# Patient Record
Sex: Male | Born: 1970 | Race: Black or African American | Hispanic: No | Marital: Single
Health system: Southern US, Community
[De-identification: ages and names within clinical notes are randomized; demographics above are authoritative.]

## PROBLEM LIST (undated history)

## (undated) DIAGNOSIS — S2242XA Multiple fractures of ribs, left side, initial encounter for closed fracture: Secondary | ICD-10-CM

## (undated) DIAGNOSIS — S82041A Displaced comminuted fracture of right patella, initial encounter for closed fracture: Secondary | ICD-10-CM

---
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
## 1999-04-21 ENCOUNTER — Emergency Department (HOSPITAL_COMMUNITY): Admission: EM | Admit: 1999-04-21 | Discharge: 1999-04-21 | Payer: Self-pay | Admitting: Emergency Medicine

## 2000-04-08 ENCOUNTER — Encounter: Payer: Self-pay | Admitting: Emergency Medicine

## 2000-04-08 ENCOUNTER — Emergency Department (HOSPITAL_COMMUNITY): Admission: EM | Admit: 2000-04-08 | Discharge: 2000-04-08 | Payer: Self-pay | Admitting: Emergency Medicine

## 2000-07-06 ENCOUNTER — Emergency Department (HOSPITAL_COMMUNITY): Admission: EM | Admit: 2000-07-06 | Discharge: 2000-07-06 | Payer: Self-pay | Admitting: Emergency Medicine

## 2009-02-24 ENCOUNTER — Emergency Department (HOSPITAL_COMMUNITY): Admission: EM | Admit: 2009-02-24 | Discharge: 2009-02-24 | Payer: Self-pay | Admitting: Emergency Medicine

## 2010-12-18 ENCOUNTER — Emergency Department (HOSPITAL_COMMUNITY)
Admission: EM | Admit: 2010-12-18 | Discharge: 2010-12-18 | Disposition: A | Discharge status: Home / Self Care | Payer: Self-pay | Attending: Emergency Medicine | Admitting: Emergency Medicine

## 2010-12-18 DIAGNOSIS — M79609 Pain in unspecified limb: Secondary | ICD-10-CM | POA: Insufficient documentation

## 2010-12-18 DIAGNOSIS — M545 Low back pain, unspecified: Secondary | ICD-10-CM | POA: Insufficient documentation

## 2010-12-18 LAB — URINALYSIS, ROUTINE W REFLEX MICROSCOPIC
Bilirubin Urine: NEGATIVE
Glucose, UA: NEGATIVE mg/dL
Hgb urine dipstick: NEGATIVE
Ketones, ur: NEGATIVE mg/dL
Nitrite: NEGATIVE
Protein, ur: NEGATIVE mg/dL
Specific Gravity, Urine: 1.024 (ref 1.005–1.030)
Urobilinogen, UA: 1 mg/dL (ref 0.0–1.0)
pH: 7 (ref 5.0–8.0)

## 2019-01-02 ENCOUNTER — Emergency Department (HOSPITAL_COMMUNITY): Payer: BLUE CROSS/BLUE SHIELD

## 2019-01-02 ENCOUNTER — Inpatient Hospital Stay (HOSPITAL_COMMUNITY)
Admission: EM | Admit: 2019-01-02 | Discharge: 2019-01-09 | DRG: 982 | Disposition: A | Discharge status: Home Health | Payer: BLUE CROSS/BLUE SHIELD | Attending: General Surgery | Admitting: General Surgery

## 2019-01-02 ENCOUNTER — Encounter (HOSPITAL_COMMUNITY): Payer: Self-pay | Admitting: Emergency Medicine

## 2019-01-02 ENCOUNTER — Inpatient Hospital Stay (HOSPITAL_COMMUNITY): Payer: BLUE CROSS/BLUE SHIELD

## 2019-01-02 ENCOUNTER — Other Ambulatory Visit: Payer: Self-pay

## 2019-01-02 DIAGNOSIS — Y92410 Unspecified street and highway as the place of occurrence of the external cause: Secondary | ICD-10-CM

## 2019-01-02 DIAGNOSIS — M959 Acquired deformity of musculoskeletal system, unspecified: Secondary | ICD-10-CM | POA: Diagnosis not present

## 2019-01-02 DIAGNOSIS — S52571A Other intraarticular fracture of lower end of right radius, initial encounter for closed fracture: Secondary | ICD-10-CM | POA: Diagnosis present

## 2019-01-02 DIAGNOSIS — S272XXA Traumatic hemopneumothorax, initial encounter: Principal | ICD-10-CM | POA: Diagnosis present

## 2019-01-02 DIAGNOSIS — M898X9 Other specified disorders of bone, unspecified site: Secondary | ICD-10-CM | POA: Diagnosis present

## 2019-01-02 DIAGNOSIS — S82121A Displaced fracture of lateral condyle of right tibia, initial encounter for closed fracture: Secondary | ICD-10-CM | POA: Diagnosis not present

## 2019-01-02 DIAGNOSIS — Z1159 Encounter for screening for other viral diseases: Secondary | ICD-10-CM

## 2019-01-02 DIAGNOSIS — S82141A Displaced bicondylar fracture of right tibia, initial encounter for closed fracture: Secondary | ICD-10-CM | POA: Diagnosis present

## 2019-01-02 DIAGNOSIS — S82001A Unspecified fracture of right patella, initial encounter for closed fracture: Secondary | ICD-10-CM | POA: Diagnosis not present

## 2019-01-02 DIAGNOSIS — S270XXA Traumatic pneumothorax, initial encounter: Secondary | ICD-10-CM | POA: Diagnosis not present

## 2019-01-02 DIAGNOSIS — S52511A Displaced fracture of right radial styloid process, initial encounter for closed fracture: Secondary | ICD-10-CM | POA: Diagnosis not present

## 2019-01-02 DIAGNOSIS — S83004A Unspecified dislocation of right patella, initial encounter: Secondary | ICD-10-CM | POA: Diagnosis present

## 2019-01-02 DIAGNOSIS — S83014A Lateral dislocation of right patella, initial encounter: Secondary | ICD-10-CM | POA: Diagnosis not present

## 2019-01-02 DIAGNOSIS — S76191A Other specified injury of right quadriceps muscle, fascia and tendon, initial encounter: Secondary | ICD-10-CM | POA: Diagnosis not present

## 2019-01-02 DIAGNOSIS — T508X5A Adverse effect of diagnostic agents, initial encounter: Secondary | ICD-10-CM | POA: Diagnosis present

## 2019-01-02 DIAGNOSIS — E876 Hypokalemia: Secondary | ICD-10-CM | POA: Diagnosis not present

## 2019-01-02 DIAGNOSIS — S2242XA Multiple fractures of ribs, left side, initial encounter for closed fracture: Secondary | ICD-10-CM | POA: Diagnosis present

## 2019-01-02 DIAGNOSIS — S82041A Displaced comminuted fracture of right patella, initial encounter for closed fracture: Secondary | ICD-10-CM | POA: Diagnosis not present

## 2019-01-02 DIAGNOSIS — Z452 Encounter for adjustment and management of vascular access device: Secondary | ICD-10-CM

## 2019-01-02 DIAGNOSIS — R52 Pain, unspecified: Secondary | ICD-10-CM

## 2019-01-02 DIAGNOSIS — J939 Pneumothorax, unspecified: Secondary | ICD-10-CM | POA: Diagnosis present

## 2019-01-02 DIAGNOSIS — S82091A Other fracture of right patella, initial encounter for closed fracture: Secondary | ICD-10-CM | POA: Diagnosis not present

## 2019-01-02 DIAGNOSIS — S43102A Unspecified dislocation of left acromioclavicular joint, initial encounter: Secondary | ICD-10-CM | POA: Diagnosis present

## 2019-01-02 DIAGNOSIS — E559 Vitamin D deficiency, unspecified: Secondary | ICD-10-CM | POA: Diagnosis not present

## 2019-01-02 DIAGNOSIS — S52501A Unspecified fracture of the lower end of right radius, initial encounter for closed fracture: Secondary | ICD-10-CM | POA: Diagnosis present

## 2019-01-02 DIAGNOSIS — S43101A Unspecified dislocation of right acromioclavicular joint, initial encounter: Secondary | ICD-10-CM | POA: Diagnosis not present

## 2019-01-02 DIAGNOSIS — D62 Acute posthemorrhagic anemia: Secondary | ICD-10-CM | POA: Diagnosis not present

## 2019-01-02 DIAGNOSIS — S52561A Barton's fracture of right radius, initial encounter for closed fracture: Secondary | ICD-10-CM | POA: Diagnosis not present

## 2019-01-02 DIAGNOSIS — S43122A Dislocation of left acromioclavicular joint, 100%-200% displacement, initial encounter: Secondary | ICD-10-CM | POA: Diagnosis present

## 2019-01-02 DIAGNOSIS — S76111A Strain of right quadriceps muscle, fascia and tendon, initial encounter: Secondary | ICD-10-CM | POA: Diagnosis present

## 2019-01-02 DIAGNOSIS — S43109A Unspecified dislocation of unspecified acromioclavicular joint, initial encounter: Secondary | ICD-10-CM

## 2019-01-02 DIAGNOSIS — T148XXA Other injury of unspecified body region, initial encounter: Secondary | ICD-10-CM

## 2019-01-02 DIAGNOSIS — N179 Acute kidney failure, unspecified: Secondary | ICD-10-CM | POA: Diagnosis present

## 2019-01-02 DIAGNOSIS — Z4682 Encounter for fitting and adjustment of non-vascular catheter: Secondary | ICD-10-CM

## 2019-01-02 DIAGNOSIS — S82002A Unspecified fracture of left patella, initial encounter for closed fracture: Secondary | ICD-10-CM | POA: Diagnosis not present

## 2019-01-02 DIAGNOSIS — S82201A Unspecified fracture of shaft of right tibia, initial encounter for closed fracture: Secondary | ICD-10-CM | POA: Diagnosis not present

## 2019-01-02 DIAGNOSIS — S52611A Displaced fracture of right ulna styloid process, initial encounter for closed fracture: Secondary | ICD-10-CM | POA: Diagnosis not present

## 2019-01-02 DIAGNOSIS — R413 Other amnesia: Secondary | ICD-10-CM | POA: Diagnosis not present

## 2019-01-02 DIAGNOSIS — Z419 Encounter for procedure for purposes other than remedying health state, unspecified: Secondary | ICD-10-CM

## 2019-01-02 DIAGNOSIS — Q899 Congenital malformation, unspecified: Secondary | ICD-10-CM

## 2019-01-02 DIAGNOSIS — F17210 Nicotine dependence, cigarettes, uncomplicated: Secondary | ICD-10-CM | POA: Diagnosis present

## 2019-01-02 DIAGNOSIS — S43132A Dislocation of left acromioclavicular joint, greater than 200% displacement, initial encounter: Secondary | ICD-10-CM | POA: Diagnosis not present

## 2019-01-02 LAB — URINALYSIS, ROUTINE W REFLEX MICROSCOPIC
Bacteria, UA: NONE SEEN
Bilirubin Urine: NEGATIVE
Glucose, UA: NEGATIVE mg/dL
Ketones, ur: NEGATIVE mg/dL
Leukocytes,Ua: NEGATIVE
Nitrite: NEGATIVE
Protein, ur: NEGATIVE mg/dL
RBC / HPF: >50 RBC/hpf — ABNORMAL HIGH (ref 0–5)
Specific Gravity, Urine: 1.038 — ABNORMAL HIGH (ref 1.005–1.030)
pH: 5 (ref 5.0–8.0)

## 2019-01-02 LAB — RAPID URINE DRUG SCREEN, HOSP PERFORMED
Amphetamines: NOT DETECTED
Barbiturates: NOT DETECTED
Benzodiazepines: NOT DETECTED
Cocaine: NOT DETECTED
Opiates: NOT DETECTED
Tetrahydrocannabinol: NOT DETECTED

## 2019-01-02 LAB — SARS CORONAVIRUS 2 BY RT PCR (HOSPITAL ORDER, PERFORMED IN ~~LOC~~ HOSPITAL LAB): SARS Coronavirus 2: NEGATIVE

## 2019-01-02 LAB — COMPREHENSIVE METABOLIC PANEL
ALT: 15 U/L (ref 0–44)
AST: 25 U/L (ref 15–41)
Albumin: 3.6 g/dL (ref 3.5–5.0)
Alkaline Phosphatase: 83 U/L (ref 38–126)
Anion gap: 12 (ref 5–15)
BUN: 9 mg/dL (ref 6–20)
CO2: 25 mmol/L (ref 22–32)
Calcium: 9.1 mg/dL (ref 8.9–10.3)
Chloride: 103 mmol/L (ref 98–111)
Creatinine, Ser: 1.62 mg/dL — ABNORMAL HIGH (ref 0.61–1.24)
GFR calc Af Amer: 58 mL/min — ABNORMAL LOW (ref >60)
GFR calc non Af Amer: 50 mL/min — ABNORMAL LOW (ref >60)
Glucose, Bld: 143 mg/dL — ABNORMAL HIGH (ref 70–99)
Potassium: 2.9 mmol/L — ABNORMAL LOW (ref 3.5–5.1)
Sodium: 140 mmol/L (ref 135–145)
Total Bilirubin: 0.7 mg/dL (ref 0.3–1.2)
Total Protein: 6.6 g/dL (ref 6.5–8.1)

## 2019-01-02 LAB — ETHANOL: Alcohol, Ethyl (B): <10 mg/dL (ref <10)

## 2019-01-02 LAB — CBC
HCT: 48.2 % (ref 39.0–52.0)
Hemoglobin: 16 g/dL (ref 13.0–17.0)
MCH: 31.4 pg (ref 26.0–34.0)
MCHC: 33.2 g/dL (ref 30.0–36.0)
MCV: 94.5 fL (ref 80.0–100.0)
Platelets: 259 10*3/uL (ref 150–400)
RBC: 5.1 MIL/uL (ref 4.22–5.81)
RDW: 12.7 % (ref 11.5–15.5)
WBC: 10.6 10*3/uL — ABNORMAL HIGH (ref 4.0–10.5)
nRBC: 0 % (ref 0.0–0.2)

## 2019-01-02 LAB — PROTIME-INR
INR: 1.1 (ref 0.8–1.2)
Prothrombin Time: 14.1 seconds (ref 11.4–15.2)

## 2019-01-02 LAB — SAMPLE TO BLOOD BANK

## 2019-01-02 LAB — LACTIC ACID, PLASMA: Lactic Acid, Venous: 3.6 mmol/L (ref 0.5–1.9)

## 2019-01-02 LAB — I-STAT CREATININE, ED: Creatinine, Ser: 1.5 mg/dL — ABNORMAL HIGH (ref 0.61–1.24)

## 2019-01-02 MED ORDER — FENTANYL CITRATE (PF) 100 MCG/2ML IJ SOLN
INTRAMUSCULAR | Status: AC
  Administered 2019-01-02: 25 ug
  Filled 2019-01-02: qty 2

## 2019-01-02 MED ORDER — FENTANYL CITRATE (PF) 100 MCG/2ML IJ SOLN
INTRAMUSCULAR | Status: AC | PRN
  Administered 2019-01-02: 100 ug via INTRAVENOUS

## 2019-01-02 MED ORDER — FENTANYL CITRATE (PF) 100 MCG/2ML IJ SOLN
INTRAMUSCULAR | Status: AC
  Filled 2019-01-02: qty 2

## 2019-01-02 MED ORDER — ENOXAPARIN SODIUM 40 MG/0.4ML ~~LOC~~ SOLN
40.0000 mg | Freq: Every day | SUBCUTANEOUS | Status: DC
  Administered 2019-01-03 – 2019-01-04 (×2): 40 mg via SUBCUTANEOUS
  Filled 2019-01-02 (×2): qty 0.4

## 2019-01-02 MED ORDER — SODIUM CHLORIDE 0.9 % IV SOLN
INTRAVENOUS | Status: AC | PRN
  Administered 2019-01-02: 1000 mL via INTRAVENOUS

## 2019-01-02 MED ORDER — ONDANSETRON HCL 4 MG/2ML IJ SOLN: 4.0000 mg | Freq: Four times a day (QID) | INTRAMUSCULAR | Status: DC | PRN

## 2019-01-02 MED ORDER — MIDAZOLAM HCL 2 MG/2ML IJ SOLN
INTRAMUSCULAR | Status: AC
  Administered 2019-01-02: 2 mg
  Filled 2019-01-02: qty 2

## 2019-01-02 MED ORDER — ONDANSETRON 4 MG PO TBDP: 4.0000 mg | ORAL_TABLET | Freq: Four times a day (QID) | ORAL | Status: DC | PRN

## 2019-01-02 MED ORDER — OXYCODONE HCL 5 MG PO TABS
10.0000 mg | ORAL_TABLET | ORAL | Status: DC | PRN
  Administered 2019-01-03 – 2019-01-09 (×16): 10 mg via ORAL
  Filled 2019-01-02 (×16): qty 2

## 2019-01-02 MED ORDER — METHOCARBAMOL 500 MG PO TABS
500.0000 mg | ORAL_TABLET | Freq: Three times a day (TID) | ORAL | Status: DC
  Administered 2019-01-02 – 2019-01-04 (×7): 500 mg via ORAL
  Filled 2019-01-02 (×7): qty 1

## 2019-01-02 MED ORDER — HYDRALAZINE HCL 20 MG/ML IJ SOLN: 10.0000 mg | INTRAMUSCULAR | Status: DC | PRN

## 2019-01-02 MED ORDER — OXYCODONE HCL 5 MG PO TABS
5.0000 mg | ORAL_TABLET | ORAL | Status: DC | PRN
  Administered 2019-01-03 – 2019-01-07 (×2): 5 mg via ORAL
  Filled 2019-01-02 (×2): qty 1

## 2019-01-02 MED ORDER — HYDROMORPHONE HCL 1 MG/ML IJ SOLN
1.0000 mg | INTRAMUSCULAR | Status: DC | PRN
  Administered 2019-01-02 – 2019-01-05 (×2): 1 mg via INTRAVENOUS
  Filled 2019-01-02 (×2): qty 1

## 2019-01-02 MED ORDER — IOHEXOL 300 MG/ML  SOLN
100.0000 mL | Freq: Once | INTRAMUSCULAR | Status: AC | PRN
  Administered 2019-01-02: 100 mL via INTRAVENOUS

## 2019-01-02 MED ORDER — SODIUM CHLORIDE 0.9 % IV SOLN
INTRAVENOUS | Status: DC
  Administered 2019-01-02 – 2019-01-06 (×5): via INTRAVENOUS

## 2019-01-02 MED ORDER — ACETAMINOPHEN 500 MG PO TABS
1000.0000 mg | ORAL_TABLET | Freq: Four times a day (QID) | ORAL | Status: DC
  Administered 2019-01-03 – 2019-01-07 (×13): 1000 mg via ORAL
  Administered 2019-01-07: 500 mg via ORAL
  Administered 2019-01-07: 1000 mg via ORAL
  Administered 2019-01-07: 500 mg via ORAL
  Administered 2019-01-08 – 2019-01-09 (×6): 1000 mg via ORAL
  Filled 2019-01-02 (×24): qty 2

## 2019-01-02 NOTE — ED Notes (Signed)
Patient transported to CT 

## 2019-01-02 NOTE — ED Notes (Signed)
Attempted to call report
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
## 2019-01-02 NOTE — ED Notes (Addendum)
Assisted in chest tube,  Timeout completed pt signed consent prior to medication administration.   Pt handled procedure well.
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
## 2019-01-03 ENCOUNTER — Inpatient Hospital Stay (HOSPITAL_COMMUNITY): Payer: BLUE CROSS/BLUE SHIELD

## 2019-01-03 ENCOUNTER — Other Ambulatory Visit: Payer: Self-pay

## 2019-01-03 LAB — BASIC METABOLIC PANEL
Anion gap: 11 (ref 5–15)
BUN: 10 mg/dL (ref 6–20)
CO2: 24 mmol/L (ref 22–32)
Calcium: 8.8 mg/dL — ABNORMAL LOW (ref 8.9–10.3)
Chloride: 105 mmol/L (ref 98–111)
Creatinine, Ser: 1.45 mg/dL — ABNORMAL HIGH (ref 0.61–1.24)
GFR calc Af Amer: >60 mL/min (ref >60)
GFR calc non Af Amer: 57 mL/min — ABNORMAL LOW (ref >60)
Glucose, Bld: 136 mg/dL — ABNORMAL HIGH (ref 70–99)
Potassium: 4.2 mmol/L (ref 3.5–5.1)
Sodium: 140 mmol/L (ref 135–145)

## 2019-01-03 LAB — CBC
HCT: 43 % (ref 39.0–52.0)
Hemoglobin: 14.2 g/dL (ref 13.0–17.0)
MCH: 31.1 pg (ref 26.0–34.0)
MCHC: 33 g/dL (ref 30.0–36.0)
MCV: 94.3 fL (ref 80.0–100.0)
Platelets: 193 10*3/uL (ref 150–400)
RBC: 4.56 MIL/uL (ref 4.22–5.81)
RDW: 13.1 % (ref 11.5–15.5)
WBC: 14.5 10*3/uL — ABNORMAL HIGH (ref 4.0–10.5)
nRBC: 0 % (ref 0.0–0.2)

## 2019-01-03 LAB — HIV ANTIBODY (ROUTINE TESTING W REFLEX): HIV Screen 4th Generation wRfx: NONREACTIVE

## 2019-01-03 LAB — CDS SEROLOGY

## 2019-01-03 MED ORDER — SENNA 8.6 MG PO TABS
1.0000 | ORAL_TABLET | Freq: Two times a day (BID) | ORAL | Status: DC
  Administered 2019-01-03 – 2019-01-09 (×11): 8.6 mg via ORAL
  Filled 2019-01-03 (×11): qty 1
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
## 2019-01-03 NOTE — Plan of Care (Signed)
  Problem: Education: Goal: Knowledge of General Education information will improve Description Including pain rating scale, medication(s)/side effects and non-pharmacologic comfort measures Outcome: Progressing   Problem: Health Behavior/Discharge Planning: Goal: Ability to manage health-related needs will improve Outcome: Progressing   Problem: Clinical Measurements: Goal: Ability to maintain clinical measurements within normal limits will improve Outcome: Progressing Goal: Will remain free from infection Outcome: Progressing Goal: Diagnostic test results will improve Outcome: Progressing Goal: Respiratory complications will improve Outcome: Progressing   Problem: Activity: Goal: Risk for activity intolerance will decrease Outcome: Progressing   Problem: Nutrition: Goal: Adequate nutrition will be maintained Outcome: Progressing   Problem: Coping: Goal: Level of anxiety will decrease Outcome: Progressing   Problem: Elimination: Goal: Will not experience complications related to urinary retention Outcome: Progressing   Problem: Pain Managment: Goal: General experience of comfort will improve Outcome: Progressing   Problem: Safety: Goal: Ability to remain free from injury will improve Outcome: Progressing   Problem: Skin Integrity: Goal: Risk for impaired skin integrity will decrease Outcome: Progressing   
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
## 2019-01-03 NOTE — Progress Notes (Signed)
Orthopedic Tech Progress Note Patient Details:  Matthew [name] 1971/08/03 [id number]  Ortho Devices Type of Ortho Device: Sugartong splint, Arm sling Ortho Device/Splint Location: LRE Ortho Device/Splint Interventions: Adjustment, Application, Ordered   Post Interventions Patient Tolerated: Well Instructions Provided: Care of device, Adjustment of device   [name] 01/03/2019, 4:11 PM

## 2019-01-03 NOTE — Progress Notes (Signed)
Orthopedic Tech Progress Note Patient Details:  Matthew [name] 1971/01/12 [id number] Applied [employer]ESS[employer] to patient right leg. Let the patient know that if he gets up out of bed to go walking that he has to wear that knee immobilizer   Ortho Devices Type of Ortho Device: Knee Immobilizer, Lenora Boys splint Ortho Device/Splint Location: LRE Ortho Device/Splint Interventions: Adjustment, Application, Ordered   Post Interventions Patient Tolerated: Well Instructions Provided: Care of device, Adjustment of device   [name] 01/03/2019, 10:37 AM

## 2019-01-04 ENCOUNTER — Inpatient Hospital Stay (HOSPITAL_COMMUNITY): Payer: BLUE CROSS/BLUE SHIELD

## 2019-01-04 LAB — BASIC METABOLIC PANEL
Anion gap: 7 (ref 5–15)
BUN: 9 mg/dL (ref 6–20)
CO2: 28 mmol/L (ref 22–32)
Calcium: 8.5 mg/dL — ABNORMAL LOW (ref 8.9–10.3)
Chloride: 103 mmol/L (ref 98–111)
Creatinine, Ser: 1.3 mg/dL — ABNORMAL HIGH (ref 0.61–1.24)
GFR calc Af Amer: >60 mL/min (ref >60)
GFR calc non Af Amer: >60 mL/min (ref >60)
Glucose, Bld: 104 mg/dL — ABNORMAL HIGH (ref 70–99)
Potassium: 4 mmol/L (ref 3.5–5.1)
Sodium: 138 mmol/L (ref 135–145)

## 2019-01-04 LAB — CBC
HCT: 41.6 % (ref 39.0–52.0)
Hemoglobin: 13.7 g/dL (ref 13.0–17.0)
MCH: 31.3 pg (ref 26.0–34.0)
MCHC: 32.9 g/dL (ref 30.0–36.0)
MCV: 95 fL (ref 80.0–100.0)
Platelets: 173 10*3/uL (ref 150–400)
RBC: 4.38 MIL/uL (ref 4.22–5.81)
RDW: 13.1 % (ref 11.5–15.5)
WBC: 8.8 10*3/uL (ref 4.0–10.5)
nRBC: 0 % (ref 0.0–0.2)

## 2019-01-04 LAB — LACTIC ACID, PLASMA: Lactic Acid, Venous: 0.9 mmol/L (ref 0.5–1.9)

## 2019-01-04 MED ORDER — POLYETHYLENE GLYCOL 3350 17 G PO PACK
17.0000 g | PACK | Freq: Two times a day (BID) | ORAL | Status: AC
  Administered 2019-01-04 (×2): 17 g via ORAL
  Filled 2019-01-04 (×2): qty 1

## 2019-01-04 MED ORDER — CEFAZOLIN SODIUM-DEXTROSE 2-4 GM/100ML-% IV SOLN
2.0000 g | INTRAVENOUS | Status: AC
  Administered 2019-01-05 (×3): 2 g via INTRAVENOUS
  Filled 2019-01-04: qty 100

## 2019-01-04 NOTE — Progress Notes (Addendum)
Orthopaedic Trauma Service (OTS)    Procedure(s) (LRB): OPEN REDUCTION INTERNAL FIXATION (ORIF) TIBIAL PLATEAU & PATELLA (Right)  Subjective: Patient reports pain as mild and moderate.    Objective: Current Vitals Blood pressure (!) 149/76, pulse 75, temperature 98.6 F (37 C), temperature source Oral, resp. rate 14, height 6\' 1"  (1.854 m), weight 89.8 kg, SpO2 94 %. Vital signs in last 24 hours: Temp:  [98.2 F (36.8 C)-99.4 F (37.4 C)] 98.6 F (37 C) (05/10 0434) Pulse Rate:  [75-85] 75 (05/10 0434) Resp:  [14] 14 (05/10 0434) BP: (117-149)/(76-87) 149/76 (05/10 0434) SpO2:  [94 %-98 %] 94 % (05/10 0434)  Intake/Output from previous day: 05/09 0701 - 05/10 0700 In: 680 [P.O.:580; I.V.:100] Out: 1125 [Urine:1075; Chest Tube:50]  LABS Recent Labs    01/02/19 1748 01/03/19 0211 01/04/19 0217  HGB 16.0 14.2 13.7   Recent Labs    01/03/19 0211 01/04/19 0217  WBC 14.5* 8.8  RBC 4.56 4.38  HCT 43.0 41.6  PLT 193 173   Recent Labs    01/03/19 0211 01/04/19 0217  NA 140 138  K 4.2 4.0  CL 105 103  CO2 24 28  BUN 10 9  CREATININE 1.45* 1.30*  GLUCOSE 136* 104*  CALCIUM 8.8* 8.5*   Recent Labs    01/02/19 1748  INR 1.1     Physical Exam RUE  Splint in place  Sens  Ax/R/M/U intact  Mot   Ax/ R/ PIN/ M/ AIN/ U intact; moves digits well  Brisk CR RLE Dressing intact, clean, dry  Edema/ swelling moderate; Skin sufficiently mobile over anticipated plateau incision but anterior knee quite swollen as anticipated  Sens: DPN, SPN, TN intact  Motor: EHL, FHL, and lessor toe ext and flex all intact grossly  Brisk cap refill, warm to touch  Assessment/Plan:   Procedure(s) (LRB): OPEN REDUCTION INTERNAL FIXATION (ORIF) TIBIAL PLATEAU & PATELLA (Right) 1. PT/OT bed to chair 2. DVT proph Lovenox 3. ORIF tomorrow of right distal radius, patella, and plateau and possible left ACJ repair by Dr. Jena Gauss or myself; NPO post MN  Myrene Galas, MD [facility] [phone] (p)
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
## 2019-01-05 ENCOUNTER — Inpatient Hospital Stay (HOSPITAL_COMMUNITY): Payer: BLUE CROSS/BLUE SHIELD | Admitting: Anesthesiology

## 2019-01-05 ENCOUNTER — Inpatient Hospital Stay (HOSPITAL_COMMUNITY): Payer: BLUE CROSS/BLUE SHIELD

## 2019-01-05 ENCOUNTER — Encounter (HOSPITAL_COMMUNITY): Admission: EM | Disposition: A | Discharge status: Home Health | Payer: Self-pay | Source: Home / Self Care

## 2019-01-05 ENCOUNTER — Encounter (HOSPITAL_COMMUNITY): Payer: Self-pay | Admitting: Certified Registered"

## 2019-01-05 HISTORY — PX: ORIF PATELLA: SHX5033

## 2019-01-05 HISTORY — PX: ORIF WRIST FRACTURE: SHX2133

## 2019-01-05 HISTORY — PX: ACROMIO-CLAVICULAR JOINT REPAIR: SHX5183

## 2019-01-05 HISTORY — PX: APPLICATION OF WOUND VAC: SHX5189

## 2019-01-05 HISTORY — PX: ORIF TIBIA PLATEAU: SHX2132

## 2019-01-05 LAB — CBC
HCT: 41.1 % (ref 39.0–52.0)
Hemoglobin: 13.7 g/dL (ref 13.0–17.0)
MCH: 31.3 pg (ref 26.0–34.0)
MCHC: 33.3 g/dL (ref 30.0–36.0)
MCV: 93.8 fL (ref 80.0–100.0)
Platelets: 169 10*3/uL (ref 150–400)
RBC: 4.38 MIL/uL (ref 4.22–5.81)
RDW: 12.6 % (ref 11.5–15.5)
WBC: 9.4 10*3/uL (ref 4.0–10.5)
nRBC: 0 % (ref 0.0–0.2)

## 2019-01-05 LAB — TYPE AND SCREEN
ABO/RH(D): B POS
Antibody Screen: NEGATIVE

## 2019-01-05 LAB — SURGICAL PCR SCREEN
MRSA, PCR: NEGATIVE
Staphylococcus aureus: NEGATIVE

## 2019-01-05 LAB — ABO/RH: ABO/RH(D): B POS

## 2019-01-05 SURGERY — OPEN REDUCTION INTERNAL FIXATION (ORIF) TIBIAL PLATEAU
Anesthesia: General | Site: Wrist | Laterality: Right

## 2019-01-05 MED ORDER — PHENYLEPHRINE 40 MCG/ML (10ML) SYRINGE FOR IV PUSH (FOR BLOOD PRESSURE SUPPORT)
PREFILLED_SYRINGE | INTRAVENOUS | Status: AC
  Filled 2019-01-05: qty 10

## 2019-01-05 MED ORDER — ONDANSETRON HCL 4 MG/2ML IJ SOLN: 4.0000 mg | Freq: Once | INTRAMUSCULAR | Status: DC | PRN

## 2019-01-05 MED ORDER — 0.9 % SODIUM CHLORIDE (POUR BTL) OPTIME
TOPICAL | Status: DC | PRN
  Administered 2019-01-05 (×2): 1000 mL

## 2019-01-05 MED ORDER — ROCURONIUM BROMIDE 10 MG/ML (PF) SYRINGE
PREFILLED_SYRINGE | INTRAVENOUS | Status: AC
  Filled 2019-01-05: qty 10

## 2019-01-05 MED ORDER — OXYCODONE HCL 5 MG PO TABS
5.0000 mg | ORAL_TABLET | Freq: Once | ORAL | Status: AC | PRN
  Administered 2019-01-05: 5 mg via ORAL

## 2019-01-05 MED ORDER — GABAPENTIN 300 MG PO CAPS
300.0000 mg | ORAL_CAPSULE | Freq: Two times a day (BID) | ORAL | Status: DC
  Administered 2019-01-05 – 2019-01-09 (×8): 300 mg via ORAL
  Filled 2019-01-05 (×8): qty 1

## 2019-01-05 MED ORDER — PROPOFOL 10 MG/ML IV BOLUS
INTRAVENOUS | Status: AC
  Filled 2019-01-05: qty 20

## 2019-01-05 MED ORDER — DEXAMETHASONE SODIUM PHOSPHATE 10 MG/ML IJ SOLN
INTRAMUSCULAR | Status: AC
  Filled 2019-01-05: qty 1

## 2019-01-05 MED ORDER — PROPOFOL 10 MG/ML IV BOLUS
INTRAVENOUS | Status: DC | PRN
  Administered 2019-01-05: 130 mg via INTRAVENOUS

## 2019-01-05 MED ORDER — ONDANSETRON HCL 4 MG/2ML IJ SOLN
INTRAMUSCULAR | Status: AC
  Filled 2019-01-05: qty 2

## 2019-01-05 MED ORDER — OXYCODONE HCL 5 MG PO TABS
ORAL_TABLET | ORAL | Status: AC
  Administered 2019-01-05: 5 mg via ORAL
  Filled 2019-01-05: qty 1

## 2019-01-05 MED ORDER — LACTATED RINGERS IV SOLN
INTRAVENOUS | Status: DC | PRN
  Administered 2019-01-05 (×2): via INTRAVENOUS

## 2019-01-05 MED ORDER — LIDOCAINE 2% (20 MG/ML) 5 ML SYRINGE
INTRAMUSCULAR | Status: DC | PRN
  Administered 2019-01-05: 60 mg via INTRAVENOUS

## 2019-01-05 MED ORDER — KETOROLAC TROMETHAMINE 15 MG/ML IJ SOLN
15.0000 mg | Freq: Three times a day (TID) | INTRAMUSCULAR | Status: AC
  Administered 2019-01-05 – 2019-01-07 (×6): 15 mg via INTRAVENOUS
  Filled 2019-01-05 (×6): qty 1

## 2019-01-05 MED ORDER — VITAMIN C 500 MG PO TABS
1000.0000 mg | ORAL_TABLET | Freq: Every day | ORAL | Status: DC
  Administered 2019-01-05 – 2019-01-09 (×5): 1000 mg via ORAL
  Filled 2019-01-05 (×5): qty 2

## 2019-01-05 MED ORDER — CEFAZOLIN SODIUM 1 G IJ SOLR
INTRAMUSCULAR | Status: AC
  Filled 2019-01-05: qty 20

## 2019-01-05 MED ORDER — PHENYLEPHRINE 40 MCG/ML (10ML) SYRINGE FOR IV PUSH (FOR BLOOD PRESSURE SUPPORT)
PREFILLED_SYRINGE | INTRAVENOUS | Status: DC | PRN
  Administered 2019-01-05 (×3): 80 ug via INTRAVENOUS

## 2019-01-05 MED ORDER — OXYCODONE HCL 5 MG/5ML PO SOLN: 5.0000 mg | Freq: Once | ORAL | Status: AC | PRN

## 2019-01-05 MED ORDER — FENTANYL CITRATE (PF) 250 MCG/5ML IJ SOLN
INTRAMUSCULAR | Status: AC
  Filled 2019-01-05: qty 5

## 2019-01-05 MED ORDER — METHOCARBAMOL 500 MG PO TABS
ORAL_TABLET | ORAL | Status: AC
  Administered 2019-01-05: 750 mg via ORAL
  Filled 2019-01-05: qty 2

## 2019-01-05 MED ORDER — MIDAZOLAM HCL 5 MG/5ML IJ SOLN
INTRAMUSCULAR | Status: DC | PRN
  Administered 2019-01-05: 2 mg via INTRAVENOUS

## 2019-01-05 MED ORDER — ROCURONIUM BROMIDE 50 MG/5ML IV SOSY
PREFILLED_SYRINGE | INTRAVENOUS | Status: DC | PRN
  Administered 2019-01-05 (×5): 50 mg via INTRAVENOUS

## 2019-01-05 MED ORDER — DEXAMETHASONE SODIUM PHOSPHATE 10 MG/ML IJ SOLN
INTRAMUSCULAR | Status: DC | PRN
  Administered 2019-01-05: 8 mg via INTRAVENOUS

## 2019-01-05 MED ORDER — CEFAZOLIN SODIUM-DEXTROSE 2-4 GM/100ML-% IV SOLN
2.0000 g | Freq: Three times a day (TID) | INTRAVENOUS | Status: AC
  Administered 2019-01-06 (×3): 2 g via INTRAVENOUS
  Filled 2019-01-05 (×3): qty 100

## 2019-01-05 MED ORDER — SUGAMMADEX SODIUM 200 MG/2ML IV SOLN
INTRAVENOUS | Status: DC | PRN
  Administered 2019-01-05: 200 mg via INTRAVENOUS

## 2019-01-05 MED ORDER — SUCCINYLCHOLINE CHLORIDE 200 MG/10ML IV SOSY
PREFILLED_SYRINGE | INTRAVENOUS | Status: DC | PRN
  Administered 2019-01-05: 140 mg via INTRAVENOUS

## 2019-01-05 MED ORDER — FENTANYL CITRATE (PF) 100 MCG/2ML IJ SOLN
INTRAMUSCULAR | Status: AC
  Administered 2019-01-05: 50 ug via INTRAVENOUS
  Filled 2019-01-05: qty 2

## 2019-01-05 MED ORDER — KETOROLAC TROMETHAMINE 15 MG/ML IJ SOLN
INTRAMUSCULAR | Status: AC
  Administered 2019-01-05: 18:00:00 15 mg via INTRAVENOUS
  Filled 2019-01-05: qty 1

## 2019-01-05 MED ORDER — ENOXAPARIN SODIUM 40 MG/0.4ML ~~LOC~~ SOLN
40.0000 mg | Freq: Every day | SUBCUTANEOUS | Status: DC
  Administered 2019-01-06 – 2019-01-09 (×4): 40 mg via SUBCUTANEOUS
  Filled 2019-01-05 (×4): qty 0.4

## 2019-01-05 MED ORDER — LIDOCAINE 2% (20 MG/ML) 5 ML SYRINGE
INTRAMUSCULAR | Status: AC
  Filled 2019-01-05: qty 5

## 2019-01-05 MED ORDER — SUCCINYLCHOLINE CHLORIDE 200 MG/10ML IV SOSY
PREFILLED_SYRINGE | INTRAVENOUS | Status: AC
  Filled 2019-01-05: qty 10

## 2019-01-05 MED ORDER — METHOCARBAMOL 750 MG PO TABS
750.0000 mg | ORAL_TABLET | Freq: Three times a day (TID) | ORAL | Status: DC
  Administered 2019-01-05 – 2019-01-09 (×13): 750 mg via ORAL
  Filled 2019-01-05 (×13): qty 1

## 2019-01-05 MED ORDER — MIDAZOLAM HCL 2 MG/2ML IJ SOLN
INTRAMUSCULAR | Status: AC
  Filled 2019-01-05: qty 2

## 2019-01-05 MED ORDER — FENTANYL CITRATE (PF) 100 MCG/2ML IJ SOLN
25.0000 ug | INTRAMUSCULAR | Status: DC | PRN
  Administered 2019-01-05: 50 ug via INTRAVENOUS

## 2019-01-05 MED ORDER — FENTANYL CITRATE (PF) 100 MCG/2ML IJ SOLN
INTRAMUSCULAR | Status: DC | PRN
  Administered 2019-01-05: 50 ug via INTRAVENOUS
  Administered 2019-01-05: 100 ug via INTRAVENOUS
  Administered 2019-01-05: 50 ug via INTRAVENOUS
  Administered 2019-01-05: 100 ug via INTRAVENOUS
  Administered 2019-01-05 (×3): 50 ug via INTRAVENOUS
  Administered 2019-01-05: 100 ug via INTRAVENOUS
  Administered 2019-01-05: 50 ug via INTRAVENOUS

## 2019-01-05 MED ORDER — ONDANSETRON HCL 4 MG/2ML IJ SOLN
INTRAMUSCULAR | Status: DC | PRN
  Administered 2019-01-05: 4 mg via INTRAVENOUS

## 2019-01-05 SURGICAL SUPPLY — 152 items
BANDAGE ACE 4X5 VEL STRL LF (GAUZE/BANDAGES/DRESSINGS) ×7 IMPLANT
BANDAGE ACE 6X5 VEL STRL LF (GAUZE/BANDAGES/DRESSINGS) ×7 IMPLANT
BANDAGE ELASTIC 3 VELCRO ST LF (GAUZE/BANDAGES/DRESSINGS) ×3 IMPLANT
BANDAGE ELASTIC 4 VELCRO ST LF (GAUZE/BANDAGES/DRESSINGS) ×8 IMPLANT
BANDAGE ELASTIC 6 VELCRO ST LF (GAUZE/BANDAGES/DRESSINGS) ×5 IMPLANT
BANDAGE ESMARK 6X9 LF (GAUZE/BANDAGES/DRESSINGS) ×5 IMPLANT
BIT DRILL 100X2.5XANTM LCK (BIT) ×1 IMPLANT
BIT DRILL 2.2 SS TIBIAL (BIT) ×4 IMPLANT
BIT DRILL 2.7XCANN QCK CNCT (BIT) ×2 IMPLANT
BIT DRILL CAL (BIT) ×2 IMPLANT
BIT DRILL CANN 2.7 (BIT) ×12
BIT DRILL CANN 2.7MM (BIT) ×2
BIT DRL 100X2.5XANTM LCK (BIT) ×5
BIT DRL 2.7XCANN QCK CNCT (BIT) ×10
BLADE CLIPPER SURG (BLADE) ×7 IMPLANT
BLADE SURG 10 STRL SS (BLADE) ×7 IMPLANT
BLADE SURG 15 STRL LF DISP TIS (BLADE) ×5 IMPLANT
BLADE SURG 15 STRL SS (BLADE) ×7
BNDG CMPR 9X4 STRL LF SNTH (GAUZE/BANDAGES/DRESSINGS) ×5
BNDG CMPR 9X6 STRL LF SNTH (GAUZE/BANDAGES/DRESSINGS) ×5
BNDG COHESIVE 4X5 TAN STRL (GAUZE/BANDAGES/DRESSINGS) ×7 IMPLANT
BNDG ESMARK 4X9 LF (GAUZE/BANDAGES/DRESSINGS) ×7 IMPLANT
BNDG ESMARK 6X9 LF (GAUZE/BANDAGES/DRESSINGS) ×7
BNDG GAUZE ELAST 4 BULKY (GAUZE/BANDAGES/DRESSINGS) ×7 IMPLANT
BONE CANC CHIPS 20CC PCAN1/4 (Bone Implant) ×7 IMPLANT
BRUSH SCRUB SURG 4.25 DISP (MISCELLANEOUS) ×14 IMPLANT
CANISTER SUCT 3000ML PPV (MISCELLANEOUS) ×7 IMPLANT
CANISTER WOUNDNEG PRESSURE 500 (CANNISTER) ×3 IMPLANT
CHIPS CANC BONE 20CC PCAN1/4 (Bone Implant) ×5 IMPLANT
CLOSURE WOUND 1/2 X4 (GAUZE/BANDAGES/DRESSINGS) ×1
COUNTER NEEDLE 20 DBL MAG RED (NEEDLE) ×3 IMPLANT
COVER SURGICAL LIGHT HANDLE (MISCELLANEOUS) ×10 IMPLANT
COVER WAND RF STERILE (DRAPES) ×7 IMPLANT
CUFF TOURNIQUET SINGLE 34IN LL (TOURNIQUET CUFF) ×7 IMPLANT
DECANTER SPIKE VIAL GLASS SM (MISCELLANEOUS) IMPLANT
DRAPE C-ARM 42X72 X-RAY (DRAPES) ×7 IMPLANT
DRAPE C-ARMOR (DRAPES) ×7 IMPLANT
DRAPE HALF SHEET 40X57 (DRAPES) ×6 IMPLANT
DRAPE IMP U-DRAPE 54X76 (DRAPES) ×7 IMPLANT
DRAPE INCISE IOBAN 66X45 STRL (DRAPES) ×4 IMPLANT
DRAPE ORTHO SPLIT 77X108 STRL (DRAPES) ×7
DRAPE SURG ORHT 6 SPLT 77X108 (DRAPES) ×5 IMPLANT
DRAPE U-SHAPE 47X51 STRL (DRAPES) ×9 IMPLANT
DRESSING PREVENA PLUS CUSTOM (GAUZE/BANDAGES/DRESSINGS) ×2 IMPLANT
DRILL BIT 2.5MM (BIT) ×7
DRILL BIT CAL (BIT) ×7
DRSG ADAPTIC 3X8 NADH LF (GAUZE/BANDAGES/DRESSINGS) ×7 IMPLANT
DRSG EMULSION OIL 3X3 NADH (GAUZE/BANDAGES/DRESSINGS) ×7 IMPLANT
DRSG MEPILEX BORDER 4X4 (GAUZE/BANDAGES/DRESSINGS) ×4 IMPLANT
DRSG PAD ABDOMINAL 8X10 ST (GAUZE/BANDAGES/DRESSINGS) ×14 IMPLANT
DRSG PREVENA PLUS CUSTOM (GAUZE/BANDAGES/DRESSINGS) ×7
ELECT NDL TIP 2.8 STRL (NEEDLE) ×3 IMPLANT
ELECT NEEDLE TIP 2.8 STRL (NEEDLE) ×7 IMPLANT
ELECT REM PT RETURN 9FT ADLT (ELECTROSURGICAL) ×7
ELECTRODE REM PT RTRN 9FT ADLT (ELECTROSURGICAL) ×5 IMPLANT
GAUZE SPONGE 4X4 12PLY STRL (GAUZE/BANDAGES/DRESSINGS) ×7 IMPLANT
GLOVE BIO SURGEON STRL SZ7.5 (GLOVE) ×11 IMPLANT
GLOVE BIO SURGEON STRL SZ8 (GLOVE) ×10 IMPLANT
GLOVE BIOGEL PI IND STRL 7.5 (GLOVE) ×7 IMPLANT
GLOVE BIOGEL PI IND STRL 8 (GLOVE) ×7 IMPLANT
GLOVE BIOGEL PI INDICATOR 7.5 (GLOVE) ×4
GLOVE BIOGEL PI INDICATOR 8 (GLOVE) ×4
GOWN STRL REUS W/ TWL LRG LVL3 (GOWN DISPOSABLE) ×12 IMPLANT
GOWN STRL REUS W/ TWL XL LVL3 (GOWN DISPOSABLE) ×7 IMPLANT
GOWN STRL REUS W/TWL LRG LVL3 (GOWN DISPOSABLE) ×28
GOWN STRL REUS W/TWL XL LVL3 (GOWN DISPOSABLE) ×14
GRAFT BNE CANC CHIPS 1-8 20CC (Bone Implant) IMPLANT
IMMOBILIZER KNEE 22 UNIV (SOFTGOODS) ×7 IMPLANT
K-WIRE 1.6 (WIRE) ×14
K-WIRE ACE 1.6X6 (WIRE) ×21
K-WIRE FX5X1.6XNS BN SS (WIRE) ×10
K-WIRE SURGICAL 1.6X102 (WIRE) ×4 IMPLANT
KIT AC JOINT DISP (KITS) ×3 IMPLANT
KIT BASIN OR (CUSTOM PROCEDURE TRAY) ×7 IMPLANT
KIT TURNOVER KIT B (KITS) ×7 IMPLANT
KWIRE ACE 1.6X6 (WIRE) ×3 IMPLANT
KWIRE FX5X1.6XNS BN SS (WIRE) ×2 IMPLANT
MANIFOLD NEPTUNE II (INSTRUMENTS) ×11 IMPLANT
NDL HYPO 25GX1X1/2 BEV (NEEDLE) ×3 IMPLANT
NDL SUT 6 .5 CRC .975X.05 MAYO (NEEDLE) ×2 IMPLANT
NEEDLE HYPO 25GX1X1/2 BEV (NEEDLE) ×7 IMPLANT
NEEDLE MAYO TAPER (NEEDLE) ×7
NS IRRIG 1000ML POUR BTL (IV SOLUTION) ×7 IMPLANT
PACK ORTHO EXTREMITY (CUSTOM PROCEDURE TRAY) ×7 IMPLANT
PACK TOTAL JOINT (CUSTOM PROCEDURE TRAY) ×7 IMPLANT
PACK UNIVERSAL I (CUSTOM PROCEDURE TRAY) ×7 IMPLANT
PAD ARMBOARD 7.5X6 YLW CONV (MISCELLANEOUS) ×14 IMPLANT
PAD CAST 3X4 CTTN HI CHSV (CAST SUPPLIES) ×5 IMPLANT
PAD CAST 4YDX4 CTTN HI CHSV (CAST SUPPLIES) ×9 IMPLANT
PADDING CAST COTTON 3X4 STRL (CAST SUPPLIES) ×7
PADDING CAST COTTON 4X4 STRL (CAST SUPPLIES) ×21
PADDING CAST COTTON 6X4 STRL (CAST SUPPLIES) ×7 IMPLANT
PEG LOCKING SMOOTH 2.2X22 (Screw) ×4 IMPLANT
PEG LOCKING SMOOTH 2.2X26 (Peg) ×4 IMPLANT
PEG LOCKING SMOOTH 2.2X28 (Peg) ×6 IMPLANT
PLATE LOCK LG 5H RT PROX TIB (Plate) ×4 IMPLANT
PLATE RIGHT VOLAR RIM DVR (Plate) ×4 IMPLANT
SCREW CANN PT 3.5X42 (Screw) ×5 IMPLANT
SCREW CANN PT SD 3.5X40 (Screw) ×3 IMPLANT
SCREW CORTICAL 3.5MM  44MM (Screw) ×4 IMPLANT
SCREW CORTICAL 3.5MM 44MM (Screw) ×4 IMPLANT
SCREW CORTICAL 3.5MM 50MM (Screw) ×5 IMPLANT
SCREW LOCK 18X2.7X 3 LD TPR (Screw) ×3 IMPLANT
SCREW LOCK 24X2.7X3 LD THRD (Screw) ×3 IMPLANT
SCREW LOCK 26X2.7X 3 LD TPR (Screw) ×2 IMPLANT
SCREW LOCK 3.5X70 816135070 (Screw) ×4 IMPLANT
SCREW LOCK CORT STAR 3.5X85 (Screw) ×6 IMPLANT
SCREW LOCK CORT STAR 3.5X90 (Screw) ×8 IMPLANT
SCREW LOCKING 2.7X18 (Screw) ×21 IMPLANT
SCREW LOCKING 2.7X24MM (Screw) ×7 IMPLANT
SCREW LOCKING 2.7X26MM (Screw) ×7 IMPLANT
SCREW LP 3.5X75MM (Screw) ×3 IMPLANT
SCREW LP 3.5X95MM (Screw) ×4 IMPLANT
SCREW NLOCK 26X2.7X3 LD THRD (Screw) ×2 IMPLANT
SCREW NONLOCK 2.7X18MM (Screw) ×6 IMPLANT
SCREW NONLOCK 2.7X26MM (Screw) ×7 IMPLANT
SLING ARM FOAM STRAP XLG (SOFTGOODS) ×3 IMPLANT
SOL PREP PROV IODINE SCRUB 4OZ (MISCELLANEOUS) ×6 IMPLANT
SPONGE LAP 18X18 RF (DISPOSABLE) ×10 IMPLANT
SPONGE LAP 18X18 X RAY DECT (DISPOSABLE) ×3 IMPLANT
STAPLER VISISTAT 35W (STAPLE) ×7 IMPLANT
STOCKINETTE IMPERVIOUS LG (DRAPES) ×7 IMPLANT
STRIP CLOSURE SKIN 1/2X4 (GAUZE/BANDAGES/DRESSINGS) ×6 IMPLANT
SUCTION FRAZIER HANDLE 10FR (MISCELLANEOUS) ×2
SUCTION TUBE FRAZIER 10FR DISP (MISCELLANEOUS) ×5 IMPLANT
SUT ETHILON 2 0 PSLX (SUTURE) ×6 IMPLANT
SUT ETHILON 3 0 PS 1 (SUTURE) ×11 IMPLANT
SUT FIBERWIRE #2 38 REV NDL BL (SUTURE) ×21
SUT FIBERWIRE 3-0 18 TAPR NDL (SUTURE) ×7
SUT MNCRL AB 4-0 PS2 18 (SUTURE) ×4 IMPLANT
SUT PROLENE 0 CT 2 (SUTURE) ×14 IMPLANT
SUT PROLENE 3 0 PS 1 (SUTURE) ×7 IMPLANT
SUT VIC AB 0 CT1 27 (SUTURE) ×21
SUT VIC AB 0 CT1 27XBRD ANBCTR (SUTURE) ×9 IMPLANT
SUT VIC AB 0 CT3 27 (SUTURE) ×2 IMPLANT
SUT VIC AB 1 CT1 27 (SUTURE) ×14
SUT VIC AB 1 CT1 27XBRD ANBCTR (SUTURE) ×6 IMPLANT
SUT VIC AB 2-0 CT1 27 (SUTURE) ×28
SUT VIC AB 2-0 CT1 TAPERPNT 27 (SUTURE) ×14 IMPLANT
SUT VIC AB 2-0 CT3 27 (SUTURE) IMPLANT
SUTURE FIBERWR 3-0 18 TAPR NDL (SUTURE) ×2 IMPLANT
SUTURE FIBERWR#2 38 REV NDL BL (SUTURE) ×6 IMPLANT
SYR CONTROL 10ML LL (SYRINGE) ×7 IMPLANT
TOWEL OR 17X24 6PK STRL BLUE (TOWEL DISPOSABLE) ×11 IMPLANT
TOWEL OR 17X26 10 PK STRL BLUE (TOWEL DISPOSABLE) ×14 IMPLANT
TRAY FOLEY MTR SLVR 16FR STAT (SET/KITS/TRAYS/PACK) IMPLANT
TUBE CONNECTING 12'X1/4 (SUCTIONS) ×3
TUBE CONNECTING 12X1/4 (SUCTIONS) ×10 IMPLANT
UNDERPAD 30X30 (UNDERPADS AND DIAPERS) ×15 IMPLANT
WATER STERILE IRR 1000ML POUR (IV SOLUTION) ×14 IMPLANT
YANKAUER SUCT BULB TIP NO VENT (SUCTIONS) ×7 IMPLANT
ZIPLOOP AC JOINT REPAIR (Orthopedic Implant) ×4 IMPLANT
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
## 2019-01-05 NOTE — Anesthesia Procedure Notes (Addendum)
Central Venous Catheter Insertion Performed by: Kipp Brood, MD, anesthesiologist Start/End5/06/2019 4:35 PM, 01/05/2019 4:45 PM Patient location: OR. Preanesthetic checklist: patient identified, IV checked, site marked, risks and benefits discussed, surgical consent, monitors and equipment checked, pre-op evaluation, timeout performed and anesthesia consent Lidocaine 1% used for infiltration and patient sedated Hand hygiene performed  and maximum sterile barriers used  Catheter size: 8 Fr Total catheter length 16. Central line was placed.Double lumen Procedure performed using ultrasound guided technique. Ultrasound Notes:image(s) printed for medical record Attempts: 1 Following insertion, dressing applied and line sutured. Post procedure assessment: blood return through all ports  Patient tolerated the procedure well with no immediate complications.

## 2019-01-05 NOTE — Transfer of Care (Signed)
Immediate Anesthesia Transfer of Care Note  Patient: Matthew [name]  Procedure(s) Performed: OPEN REDUCTION INTERNAL FIXATION (ORIF) TIBIAL PLATEAU & PATELLA (Right Leg Lower) Acromio-Clavicular Joint Repair (Left Shoulder) Open Reduction Internal (Orif) Fixation Patella (Right Knee) Open Reduction Internal Fixation (Orif) Wrist Fracture (Right Wrist) Application Of Wound Vac (Right Knee)  Patient Location: PACU  Anesthesia Type:General  Level of Consciousness: awake and patient cooperative  Airway & Oxygen Therapy: Patient Spontanous Breathing and Patient connected to nasal cannula oxygen  Post-op Assessment: Report given to RN and Post -op Vital signs reviewed and stable  Post vital signs: Reviewed and stable  Last Vitals:  Vitals Value Taken Time  BP    Temp    Pulse    Resp    SpO2      Last Pain:  Vitals:   01/05/19 0518  TempSrc: Oral  PainSc:       Patients Stated Pain Goal: 0 (01/04/19 0853)  Complications: No apparent anesthesia complications
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
## 2019-01-05 NOTE — Brief Op Note (Signed)
01/05/2019  4:39 PM  PATIENT:  Matthew [name]  48 y.o. male  PRE-OPERATIVE DIAGNOSIS:   1. RIGHT LATERAL TIBIAL PLATEAU 2. RIGHT COMMINUTED PATELLA FRACTURE 3. RIGHT KNEE TORN MEDIAL PATELLOFEMORAL LIGAMENT  4. LEFT ACROMIOCLAVICULAR DISLOCATION, TYPE 3 5. RIGHT DISTAL RADIUS AND ULNAR STYLOID FRACTURE, INTRAARTICULAR WITH FOUR FRAGMENTS  POST-OPERATIVE DIAGNOSIS:  1. RIGHT LATERAL TIBIAL PLATEAU 2. RIGHT COMMINUTED PATELLA FRACTURE 3. RIGHT KNEE TORN MEDIAL PATELLOFEMORAL LIGAMENT  4. LEFT ACROMIOCLAVICULAR DISLOCATION, TYPE 3 5. RIGHT DISTAL RADIUS AND ULNAR STYLOID FRACTURE, INTRAARTICULAR WITH FOUR FRAGMENTS  PROCEDURE:  Procedure(s): 1. ORIF RIGHT LATERAL TIBIAL PLATEAU 2. ORIF OF RIGHT PATELLA 3. REPAIR OF MEDIAL PATELLOFEMORAL LIGAMENT 4. RIGHT LEG ANTERIOR COMPARTMENT FASCIOTOMY 5. STRESS KNEE FILM UNDER FLUORO 6. OPEN REPAIR OF LEFT ACROMIOCLAVICULAR SEPARATION 7. OPEN REDUCTION INTERNAL FIXATION RIGHT DISTAL RADIUS MULTIFRAGMENTARY, INTRAARTICULAR FRACTURE  8. Application Of Wound Vac (Right)  SURGEON:  Surgeon(s) and Role:    Myrene Galas, MD - Primary  PHYSICIAN ASSISTANT: Montez Morita, PA-C  ANESTHESIA:   general  EBL:  250 mL   BLOOD ADMINISTERED:none  DRAINS: none   LOCAL MEDICATIONS USED:  NONE  SPECIMEN:  No Specimen  DISPOSITION OF SPECIMEN:  N/A  COUNTS:  YES  TOURNIQUET:  * No tourniquets in log *  DICTATION: .Other Dictation: Dictation Number [id number]  PLAN OF CARE: Admit to inpatient   PATIENT DISPOSITION:  PACU - hemodynamically stable.   Delay start of Pharmacological VTE agent (>24hrs) due to surgical blood loss or risk of bleeding: no

## 2019-01-05 NOTE — Anesthesia Procedure Notes (Signed)
Procedure Name: Intubation Date/Time: 01/05/2019 8:30 AM Performed by: Julian Reil, CRNA Pre-anesthesia Checklist: Patient identified, Emergency Drugs available, Suction available and Patient being monitored Patient Re-evaluated:Patient Re-evaluated prior to induction Oxygen Delivery Method: Circle system utilized Preoxygenation: Pre-oxygenation with 100% oxygen Induction Type: IV induction and Rapid sequence Laryngoscope Size: Miller and 3 Grade View: Grade I Tube type: Oral Tube size: 7.5 mm Number of attempts: 1 Airway Equipment and Method: Stylet Placement Confirmation: ETT inserted through vocal cords under direct vision,  breath sounds checked- equal and bilateral and positive ETCO2 Secured at: 22 cm Tube secured with: Tape Dental Injury: Teeth and Oropharynx as per pre-operative assessment  Comments: RSI due to Covid-19 pandemic concerns.  Partials out per patient in room.  4x4s bite block used.

## 2019-01-05 NOTE — Progress Notes (Signed)
I discussed with the patient the risks and benefits of surgery for repair of right patella and plateau, right distal radius, and left AC joint, including the possibility of infection, nerve injury, vessel injury, wound breakdown, arthritis, symptomatic hardware, DVT/ PE, loss of motion, malunion, nonunion, and need for further surgery among others. He acknowledged these risks and wished to proceed.  Myrene Galas, MD [facility], [facility] [phone]

## 2019-01-05 NOTE — Anesthesia Preprocedure Evaluation (Signed)

## 2019-01-05 NOTE — Anesthesia Postprocedure Evaluation (Signed)
Anesthesia Post Note  Patient: JWAN MILLAY  Procedure(s) Performed: OPEN REDUCTION INTERNAL FIXATION (ORIF) TIBIAL PLATEAU & PATELLA (Right Leg Lower) Acromio-Clavicular Joint Repair (Left Shoulder) Open Reduction Internal (Orif) Fixation Patella (Right Knee) Open Reduction Internal Fixation (Orif) Wrist Fracture (Right Wrist) Application Of Wound Vac (Right Knee)     Patient location during evaluation: PACU Anesthesia Type: General Level of consciousness: awake and alert Pain management: pain level controlled Vital Signs Assessment: post-procedure vital signs reviewed and stable Respiratory status: spontaneous breathing, nonlabored ventilation, respiratory function stable and patient connected to nasal cannula oxygen Cardiovascular status: blood pressure returned to baseline and stable Postop Assessment: no apparent nausea or vomiting Anesthetic complications: no    Last Vitals:  Vitals:   01/05/19 1846 01/05/19 1958  BP: (!) 144/92 (!) 162/95  Pulse: (!) 106 96  Resp: 18 (!) 22  Temp: 37 C 36.9 C  SpO2: 96% 96%    Last Pain:  Vitals:   01/05/19 1958  TempSrc: Oral  PainSc:                  Emmah Bratcher COKER

## 2019-01-06 ENCOUNTER — Encounter (HOSPITAL_COMMUNITY): Payer: Self-pay | Admitting: Orthopedic Surgery

## 2019-01-06 ENCOUNTER — Inpatient Hospital Stay (HOSPITAL_COMMUNITY): Payer: BLUE CROSS/BLUE SHIELD

## 2019-01-06 DIAGNOSIS — S82041A Displaced comminuted fracture of right patella, initial encounter for closed fracture: Secondary | ICD-10-CM

## 2019-01-06 DIAGNOSIS — S83004A Unspecified dislocation of right patella, initial encounter: Secondary | ICD-10-CM

## 2019-01-06 DIAGNOSIS — S82121A Displaced fracture of lateral condyle of right tibia, initial encounter for closed fracture: Secondary | ICD-10-CM

## 2019-01-06 DIAGNOSIS — S52501A Unspecified fracture of the lower end of right radius, initial encounter for closed fracture: Secondary | ICD-10-CM

## 2019-01-06 DIAGNOSIS — S43102A Unspecified dislocation of left acromioclavicular joint, initial encounter: Secondary | ICD-10-CM

## 2019-01-06 DIAGNOSIS — S2242XA Multiple fractures of ribs, left side, initial encounter for closed fracture: Secondary | ICD-10-CM

## 2019-01-06 HISTORY — DX: Unspecified dislocation of left acromioclavicular joint, initial encounter: S43.102A

## 2019-01-06 HISTORY — DX: Multiple fractures of ribs, left side, initial encounter for closed fracture: S22.42XA

## 2019-01-06 HISTORY — DX: Displaced comminuted fracture of right patella, initial encounter for closed fracture: S82.041A

## 2019-01-06 HISTORY — DX: Unspecified fracture of the lower end of right radius, initial encounter for closed fracture: S52.501A

## 2019-01-06 HISTORY — DX: Displaced fracture of lateral condyle of right tibia, initial encounter for closed fracture: S82.121A

## 2019-01-06 HISTORY — DX: Unspecified dislocation of right patella, initial encounter: S83.004A

## 2019-01-06 LAB — CBC
HCT: 35.2 % — ABNORMAL LOW (ref 39.0–52.0)
Hemoglobin: 11.9 g/dL — ABNORMAL LOW (ref 13.0–17.0)
MCH: 31.7 pg (ref 26.0–34.0)
MCHC: 33.8 g/dL (ref 30.0–36.0)
MCV: 93.9 fL (ref 80.0–100.0)
Platelets: 173 10*3/uL (ref 150–400)
RBC: 3.75 MIL/uL — ABNORMAL LOW (ref 4.22–5.81)
RDW: 12.8 % (ref 11.5–15.5)
WBC: 12.5 10*3/uL — ABNORMAL HIGH (ref 4.0–10.5)
nRBC: 0 % (ref 0.0–0.2)

## 2019-01-06 LAB — COMPREHENSIVE METABOLIC PANEL
ALT: 26 U/L (ref 0–44)
AST: 72 U/L — ABNORMAL HIGH (ref 15–41)
Albumin: 2.6 g/dL — ABNORMAL LOW (ref 3.5–5.0)
Alkaline Phosphatase: 59 U/L (ref 38–126)
Anion gap: 8 (ref 5–15)
BUN: 15 mg/dL (ref 6–20)
CO2: 27 mmol/L (ref 22–32)
Calcium: 8.3 mg/dL — ABNORMAL LOW (ref 8.9–10.3)
Chloride: 101 mmol/L (ref 98–111)
Creatinine, Ser: 1.29 mg/dL — ABNORMAL HIGH (ref 0.61–1.24)
GFR calc Af Amer: >60 mL/min (ref >60)
GFR calc non Af Amer: >60 mL/min (ref >60)
Glucose, Bld: 143 mg/dL — ABNORMAL HIGH (ref 70–99)
Potassium: 4 mmol/L (ref 3.5–5.1)
Sodium: 136 mmol/L (ref 135–145)
Total Bilirubin: 0.7 mg/dL (ref 0.3–1.2)
Total Protein: 5.8 g/dL — ABNORMAL LOW (ref 6.5–8.1)
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
## 2019-01-06 NOTE — Plan of Care (Signed)
  Problem: Safety: Goal: Ability to remain free from injury will improve Outcome: Progressing   Problem: Skin Integrity: Goal: Risk for impaired skin integrity will decrease Outcome: Progressing   
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
## 2019-01-06 NOTE — Progress Notes (Signed)
Orthopedic Tech Progress Note Patient Details:  Matthew [name] Aug 18, 1971 [id number]  Patient ID: Matthew [name], male   DOB: [birth year], 48 y.o.   MRN: [id number]   Saul Fordyce 01/06/2019, 10:28 AMCalled [facility] for right  Hinged knee brace.
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
## 2019-01-07 ENCOUNTER — Inpatient Hospital Stay (HOSPITAL_COMMUNITY): Payer: BLUE CROSS/BLUE SHIELD

## 2019-01-07 ENCOUNTER — Encounter (HOSPITAL_COMMUNITY): Payer: Self-pay | Admitting: Orthopedic Surgery

## 2019-01-07 LAB — CBC
HCT: 33 % — ABNORMAL LOW (ref 39.0–52.0)
Hemoglobin: 11.1 g/dL — ABNORMAL LOW (ref 13.0–17.0)
MCH: 31.7 pg (ref 26.0–34.0)
MCHC: 33.6 g/dL (ref 30.0–36.0)
MCV: 94.3 fL (ref 80.0–100.0)
Platelets: 215 10*3/uL (ref 150–400)
RBC: 3.5 MIL/uL — ABNORMAL LOW (ref 4.22–5.81)
RDW: 12.7 % (ref 11.5–15.5)
WBC: 8.7 10*3/uL (ref 4.0–10.5)
nRBC: 0 % (ref 0.0–0.2)

## 2019-01-07 LAB — VITAMIN D 25 HYDROXY (VIT D DEFICIENCY, FRACTURES): Vit D, 25-Hydroxy: 12.8 ng/mL — ABNORMAL LOW (ref 30.0–100.0)

## 2019-01-07 MED ORDER — SODIUM CHLORIDE 0.9% FLUSH: 10.0000 mL | INTRAVENOUS | Status: DC | PRN
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
## 2019-01-07 NOTE — Plan of Care (Signed)
  Problem: Pain Managment: Goal: General experience of comfort will improve Outcome: Progressing   Problem: Safety: Goal: Ability to remain free from injury will improve Outcome: Progressing   Problem: Skin Integrity: Goal: Risk for impaired skin integrity will decrease Outcome: Progressing   

## 2019-01-07 NOTE — Progress Notes (Signed)
Girlfriend , Laural Golden [phone], called for an update.  She is agreeable for him to come home with her to help him.  She does have steps up to a second floor bedroom, but does have room downstairs for hospital bed if he is unable to get up steps, although the bathtub/shower is upstairs as well.  Will talk to PT about if steps are possible or not.  Letha Cape 11:31 AM 01/07/2019

## 2019-01-08 ENCOUNTER — Inpatient Hospital Stay (HOSPITAL_COMMUNITY): Payer: BLUE CROSS/BLUE SHIELD

## 2019-01-08 LAB — BASIC METABOLIC PANEL
Anion gap: 10 (ref 5–15)
BUN: 10 mg/dL (ref 6–20)
CO2: 28 mmol/L (ref 22–32)
Calcium: 8.4 mg/dL — ABNORMAL LOW (ref 8.9–10.3)
Chloride: 101 mmol/L (ref 98–111)
Creatinine, Ser: 1.07 mg/dL (ref 0.61–1.24)
GFR calc Af Amer: >60 mL/min (ref >60)
GFR calc non Af Amer: >60 mL/min (ref >60)
Glucose, Bld: 96 mg/dL (ref 70–99)
Potassium: 3.6 mmol/L (ref 3.5–5.1)
Sodium: 139 mmol/L (ref 135–145)
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
## 2019-01-08 NOTE — Care Management (Signed)
    Durable Medical Equipment  (From admission, onward)         Start     Ordered   01/08/19 0816  For home use only DME Hospital bed  Once    Question Answer Comment  The above medical condition requires: Patient requires the ability to reposition frequently   Bed type Semi-electric      01/08/19 0815   01/07/19 0921  For home use only DME 3 n 1  Once     01/07/19 0921   01/07/19 0920  For home use only DME Walker platform  Once    Question:  Patient needs a walker to treat with the following condition  Answer:  Tibial plateau fracture   01/07/19 4970
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
## 2019-01-09 ENCOUNTER — Inpatient Hospital Stay (HOSPITAL_COMMUNITY): Payer: BLUE CROSS/BLUE SHIELD

## 2019-01-09 LAB — CREATININE, SERUM
Creatinine, Ser: 1.06 mg/dL (ref 0.61–1.24)
GFR calc Af Amer: >60 mL/min (ref >60)
GFR calc non Af Amer: >60 mL/min (ref >60)

## 2019-01-09 MED ORDER — CHOLECALCIFEROL 10 MCG (400 UNIT) PO TABS
2000.0000 [IU] | ORAL_TABLET | Freq: Two times a day (BID) | ORAL | Status: DC
  Administered 2019-01-09: 2000 [IU] via ORAL
  Filled 2019-01-09: qty 5

## 2019-01-09 MED ORDER — POLYETHYLENE GLYCOL 3350 17 G PO PACK: 17.0000 g | PACK | Freq: Every day | ORAL | 0 refills | Status: AC

## 2019-01-09 MED ORDER — OXYCODONE HCL 10 MG PO TABS: 5.0000 mg | ORAL_TABLET | ORAL | 0 refills | Status: AC | PRN

## 2019-01-09 MED ORDER — ASCORBIC ACID 1000 MG PO TABS: 1000.0000 mg | ORAL_TABLET | Freq: Every day | ORAL | 0 refills | Status: AC

## 2019-01-09 MED ORDER — ENOXAPARIN SODIUM 40 MG/0.4ML ~~LOC~~ SOLN: 40.0000 mg | Freq: Every day | SUBCUTANEOUS | 0 refills | Status: DC

## 2019-01-09 MED ORDER — RIVAROXABAN 10 MG PO TABS: 10.0000 mg | ORAL_TABLET | Freq: Every day | ORAL | 0 refills | Status: DC

## 2019-01-09 MED ORDER — VITAMIN D3 125 MCG (5000 UT) PO TABS: 1.0000 | ORAL_TABLET | Freq: Every day | ORAL | 3 refills | Status: AC

## 2019-01-09 MED ORDER — GABAPENTIN 300 MG PO CAPS: 300.0000 mg | ORAL_CAPSULE | Freq: Two times a day (BID) | ORAL | 0 refills | Status: AC

## 2019-01-09 MED ORDER — METHOCARBAMOL 750 MG PO TABS: 750.0000 mg | ORAL_TABLET | Freq: Three times a day (TID) | ORAL | 0 refills | Status: DC

## 2019-01-09 MED ORDER — ACETAMINOPHEN 500 MG PO TABS: 1000.0000 mg | ORAL_TABLET | Freq: Four times a day (QID) | ORAL | 0 refills | Status: AC

## 2019-01-09 MED ORDER — POLYETHYLENE GLYCOL 3350 17 G PO PACK
17.0000 g | PACK | Freq: Every day | ORAL | Status: DC
  Administered 2019-01-09: 17 g via ORAL
  Filled 2019-01-09: qty 1

## 2019-01-09 MED FILL — GABAPENTIN 300 MG CAPSULE: 300 | 15 days supply | Qty: 30 | Fill #0

## 2019-01-09 MED FILL — oxyCODONE HCL 10 MG TABS: 10 | 5 days supply | Qty: 30 | Fill #0

## 2019-01-09 MED FILL — XARELTO 10 MG TABLET: 10 | 30 days supply | Qty: 30 | Fill #0

## 2019-01-09 MED FILL — METHOCARBAMOL 750 MG TABS: 750 | 10 days supply | Qty: 30 | Fill #0

## 2019-01-09 MED FILL — POLYETHYLENE GLYCOL 3350 PO: 17 | 28 days supply | Qty: 510 | Fill #0
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
## 2019-01-09 NOTE — Discharge Instructions (Addendum)
You need to obtain a primary care provider to have an indeterminate 2cm nodule on the upper pole of your right kidney followed.  Indeterminate means they are unable to tell what this is and it will need further evaluation and work up.   Pneumothorax A pneumothorax is commonly called a collapsed lung. It is a condition in which air leaks from a lung and builds up between the thin layer of tissue that covers the lungs (visceral pleura) and the interior wall of the chest cavity (parietal pleura). The air gets trapped outside the lung, between the lung and the chest wall (pleural space). The air takes up space and prevents the lung from fully expanding. This condition sometimes occurs suddenly with no apparent cause. The buildup of air may be small or large. A small pneumothorax may go away on its own. A large pneumothorax will require treatment and hospitalization. What are the causes? This condition may be caused by:  Trauma and injury to the chest wall.  Surgery and other medical procedures.  A complication of an underlying lung problem, especially chronic obstructive pulmonary disease (COPD) or emphysema. Sometimes the cause of this condition is not known. What increases the risk? You are more likely to develop this condition if:  You have an underlying lung problem.  You smoke.  You are 66-86 years old, male, tall, and underweight.  You have a personal or family history of pneumothorax.  You have an eating disorder (anorexia nervosa). This condition can also happen quickly, even in people with no history of lung problems. What are the signs or symptoms? Sometimes a pneumothorax will have no symptoms. When symptoms are present, they can include:  Chest pain.  Shortness of breath.  Increased rate of breathing.  Bluish color to your lips or skin (cyanosis). How is this diagnosed? This condition may be diagnosed by:  A medical history and physical exam.  A chest X-ray, chest  CT scan, or ultrasound. How is this treated? Treatment depends on how severe your condition is. The goal of treatment is to remove the extra air and allow your lung to expand back to its normal size.  For a small pneumothorax: ? No treatment may be needed. ? Extra oxygen is sometimes used to make it go away more quickly.  For a large pneumothorax or a pneumothorax that is causing symptoms, a procedure is done to drain the air from your lungs. To do this, a health care provider may use: ? A needle with a syringe. This is used to suck air from a pleural space where no additional leakage is taking place. ? A chest tube. This is used to suck air where there is ongoing leakage into the pleural space. The chest tube may need to remain in place for several days until the air leak has healed.  In more severe cases, surgery may be needed to repair the damage that is causing the leak.  If you have multiple pneumothorax episodes or have an air leak that will not heal, a procedure called a pleurodesis may be done. A medicine is placed in the pleural space to irritate the tissues around the lung so that the lung will stick to the chest wall, seal any leaks, and stop any buildup of air in that space. If you have an underlying lung problem, severe symptoms, or a large pneumothorax you will usually need to stay in the hospital. Follow these instructions at home: Lifestyle  Do not use any products that contain  nicotine or tobacco, such as cigarettes and e-cigarettes. These are major risk factors in pneumothorax. If you need help quitting, ask your health care provider.  Do not lift anything that is heavier than 10 lb (4.5 kg), or the limit that your health care provider tells you, until he or she says that it is safe.  Avoid activities that take a lot of effort (strenuous) for as long as told by your health care provider.  Return to your normal activities as told by your health care provider. Ask your health  care provider what activities are safe for you.  Do not fly in an airplane or scuba dive until your health care provider says it is okay. General instructions  Take over-the-counter and prescription medicines only as told by your health care provider.  If a cough or pain makes it difficult for you to sleep at night, try sleeping in a semi-upright position in a recliner or by using 2 or 3 pillows.  If you had a chest tube and it was removed, ask your health care provider when you can remove the bandage (dressing). While the dressing is in place, do not allow it to get wet.  Keep all follow-up visits as told by your health care provider. This is important. Contact a health care provider if:  You cough up thick mucus (sputum) that is yellow or green in color.  You were treated with a chest tube, and you have redness, increasing pain, or discharge at the site where it was placed. Get help right away if:  You have increasing chest pain or shortness of breath.  You have a cough that will not go away.  You begin coughing up blood.  You have pain that is getting worse or is not controlled with medicines.  The site where your chest tube was located opens up.  You feel air coming out of the site where the chest tube was placed.  You have a fever or persistent symptoms for more than 2-3 days.  You have a fever and your symptoms suddenly get worse. These symptoms may represent a serious problem that is an emergency. Do not wait to see if the symptoms will go away. Get medical help right away. Call your local emergency services (911 in the [place] Do not drive yourself to the hospital. Summary  A pneumothorax, commonly called a collapsed lung, is a condition in which air leaks from a lung and gets trapped between the lung and the chest wall (pleural space).  The buildup of air may be small or large. A small pneumothorax may go away on its own. A large pneumothorax will require treatment and  hospitalization.  Treatment for this condition depends on how severe the pneumothorax is. The goal of treatment is to remove the extra air and allow the lung to expand back to its normal size. This information is not intended to replace advice given to you by your health care provider. Make sure you discuss any questions you have with your health care provider. Document Released: 08/13/2005 Document Revised: 07/22/2017 Document Reviewed: 07/22/2017 Elsevier Interactive Patient Education  2019 [employer].    Orthopaedic Trauma Service Discharge Instructions   General Discharge Instructions  WEIGHT BEARING STATUS: Nonweightbearing right lower extremity, nonweightbearing right wrist, weight-bear as tolerated left upper extremity and left lower extremity  RANGE OF MOTION/ACTIVITY: Absolutely no knee range of motion whatsoever at this time.  Knee brace needs to be on at all times and locked in full  extension except when doing dressing changes or hygiene (bathing).  Okay to move toes and ankle on right foot.  Right hand finger range of motion as tolerated.  You are unable to move your wrist as you are currently in a splint.  Okay to move elbow and shoulder on right upper extremity as well.  Unrestricted range of motion of left upper extremity and left lower extremity  Continue to do exercises that therapy taught you during her hospital stay several times a day.  Ice after doing activity to help control inflammation  Bone health: Work-up to evaluate your bone health shows that you do have some vitamin D deficiency.  You are currently on supplementation.  Would recommend taking vitamin D3 5000 use daily and vitamin C 500 mg daily.  We can recheck your vitamin D levels in 12 weeks to further evaluate but would likely recommend lifelong vitamin D supplementation  Wound Care: Daily wound care starting on 01/11/2019.  Please see instructions below.  Call office if there are any  questions  Discharge Wound Care Instructions  Do NOT apply any ointments, solutions or lotions to pin sites or surgical wounds.  These prevent needed drainage and even though solutions like hydrogen peroxide kill bacteria, they also damage cells lining the pin sites that help fight infection.  Applying lotions or ointments can keep the wounds moist and can cause them to breakdown and open up as well. This can increase the risk for infection. When in doubt call the office.  Surgical incisions should be dressed daily.  If any drainage is noted, use one layer of adaptic (non-stick mesh looking sheet, also called oil emulsion gauze), then gauze, Kerlix (rolled gauze), and an ace wrap or TED hose (compression sock).  All dressing supplies can be bought at a medical supply store such as [facility] or [facility].  They can also be purchased through our office.   Once the incision is completely dry and without drainage, it may be left open to air out.  Showering may begin 36-48 hours later.  Cleaning gently with soap and water.  Traumatic wounds should be dressed daily as well.    One layer of adaptic, gauze, Kerlix, then ace wrap.  The adaptic can be discontinued once the draining has ceased    If you have a wet to dry dressing: wet the gauze with saline the squeeze as much saline out so the gauze is moist (not soaking wet), place moistened gauze over wound, then place a dry gauze over the moist one, followed by Kerlix wrap, then ace wrap.  DVT/PE prophylaxis: xarelto 10 mg by mouth daily x 30 days.  This is to prevent the development of blood clots  Diet: as you were eating previously.  Can use over the counter stool softeners and bowel preparations, such as Miralax, to help with bowel movements.  Narcotics can be constipating.  Be sure to drink plenty of fluids  PAIN MEDICATION USE AND EXPECTATIONS  You have likely been given narcotic medications to help control your pain.  After a  traumatic event that results in an fracture (broken bone) with or without surgery, it is ok to use narcotic pain medications to help control one's pain.  We understand that everyone responds to pain differently and each individual patient will be evaluated on a regular basis for the continued need for narcotic medications. Ideally, narcotic medication use should last no more than 6-8 weeks (coinciding with fracture healing).   As  a patient it is your responsibility as well to monitor narcotic medication use and report the amount and frequency you use these medications when you come to your office visit.   We would also advise that if you are using narcotic medications, you should take a dose prior to therapy to maximize you participation.  IF YOU ARE ON NARCOTIC MEDICATIONS IT IS NOT PERMISSIBLE TO OPERATE A MOTOR VEHICLE (MOTORCYCLE/CAR/TRUCK/MOPED) OR HEAVY MACHINERY DO NOT MIX NARCOTICS WITH OTHER CNS (CENTRAL NERVOUS SYSTEM) DEPRESSANTS SUCH AS ALCOHOL   STOP SMOKING OR USING NICOTINE PRODUCTS!!!!  As discussed nicotine severely impairs your body's ability to heal surgical and traumatic wounds but also impairs bone healing.  Wounds and bone heal by forming microscopic blood vessels (angiogenesis) and nicotine is a vasoconstrictor (essentially, shrinks blood vessels).  Therefore, if vasoconstriction occurs to these microscopic blood vessels they essentially disappear and are unable to deliver necessary nutrients to the healing tissue.  This is one modifiable factor that you can do to dramatically increase your chances of healing your injury.    (This means no smoking, no nicotine gum, patches, etc)  DO NOT USE NONSTEROIDAL ANTI-INFLAMMATORY DRUGS (NSAID'S)  Using products such as Advil (ibuprofen), Aleve (naproxen), Motrin (ibuprofen) for additional pain control during fracture healing can delay and/or prevent the healing response.  If you would like to take over the counter (OTC) medication, Tylenol  (acetaminophen) is ok.  However, some narcotic medications that are given for pain control contain acetaminophen as well. Therefore, you should not exceed more than 4000 mg of tylenol in a day if you do not have liver disease.  Also note that there are may OTC medicines, such as cold medicines and allergy medicines that my contain tylenol as well.  If you have any questions about medications and/or interactions please ask your doctor/PA or your pharmacist.      ICE AND ELEVATE INJURED/OPERATIVE EXTREMITY  Using ice and elevating the injured extremity above your heart can help with swelling and pain control.  Icing in a pulsatile fashion, such as 20 minutes on and 20 minutes off, can be followed.    Do not place ice directly on skin. Make sure there is a barrier between to skin and the ice pack.    Using frozen items such as frozen peas works well as the conform nicely to the are that needs to be iced.  USE AN ACE WRAP OR TED HOSE FOR SWELLING CONTROL  In addition to icing and elevation, Ace wraps or TED hose are used to help limit and resolve swelling.  It is recommended to use Ace wraps or TED hose until you are informed to stop.    When using Ace Wraps start the wrapping distally (farthest away from the body) and wrap proximally (closer to the body)   Example: If you had surgery on your leg or thing and you do not have a splint on, start the ace wrap at the toes and work your way up to the thigh        If you had surgery on your upper extremity and do not have a splint on, start the ace wrap at your fingers and work your way up to the upper arm  IF YOU ARE IN A SPLINT OR CAST DO NOT REMOVE IT FOR ANY REASON   If your splint gets wet for any reason please contact the office immediately. You may shower in your splint or cast as long as you keep it dry.  This can be done by wrapping in a cast cover or garbage back (or similar)  Do Not stick any thing down your splint or cast such as pencils, money, or  hangers to try and scratch yourself with.  If you feel itchy take benadryl as prescribed on the bottle for itching  IF YOU ARE IN A CAM BOOT (BLACK BOOT)  You may remove boot periodically. Perform daily dressing changes as noted below.  Wash the liner of the boot regularly and wear a sock when wearing the boot. It is recommended that you sleep in the boot until told otherwise  CALL THE OFFICE WITH ANY QUESTIONS OR CONCERNS: [phone]    Cast or Splint Care, Adult Casts and splints are supports that are worn to protect broken bones and other injuries. A cast or splint may hold a bone still and in the correct position while it heals. Casts and splints may also help to ease pain, swelling, and muscle spasms. How to care for your cast   Do not stick anything inside the cast to scratch your skin.  Check the skin around the cast every day. Tell your doctor about any concerns.  You may put lotion on dry skin around the edges of the cast. Do not put lotion on the skin under the cast.  Keep the cast clean.  If the cast is not waterproof: ? Do not let it get wet. ? Cover it with a watertight covering when you take a bath or a shower. How to care for your splint   Wear it as told by your doctor. Take it off only as told by your doctor.  Loosen the splint if your fingers or toes tingle, get numb, or turn cold and blue.  Keep the splint clean.  If the splint is not waterproof: ? Do not let it get wet. ? Cover it with a watertight covering when you take a bath or a shower. Follow these instructions at home: Bathing  Do not take baths or swim until your doctor says it is okay. Ask your doctor if you can take showers. You may only be allowed to take sponge baths for bathing.  If your cast or splint is not waterproof, cover it with a watertight covering when you take a bath or shower. Managing pain, stiffness, and swelling  Move your fingers or toes often to avoid stiffness and to  lessen swelling.  Raise (elevate) the injured area above the level of your heart while sitting or lying down. Safety  Do not use the injured limb to support your body weight until your doctor says that it is okay.  Use crutches or other assistive devices as told by your doctor. General instructions  Do not put pressure on any part of the cast or splint until it is fully hardened. This may take many hours.  Return to your normal activities as told by your doctor. Ask your doctor what activities are safe for you.  Keep all follow-up visits as told by your doctor. This is important. Contact a doctor if:  Your cast or splint gets damaged.  The skin around the cast gets red or raw.  The skin under the cast is very itchy or painful.  Your cast or splint feels very uncomfortable.  Your cast or splint is too tight or too loose.  Your cast becomes wet or it starts to have a soft spot or area.  You get an object stuck under your cast. Get help  right away if:  Your pain gets worse.  The injured area tingles, gets numb, or turns blue and cold.  The part of your body above or below the cast is swollen and it turns a different color (is discolored).  You cannot feel or move your fingers or toes.  There is fluid leaking through the cast.  You have very bad pain or pressure under the cast.  You have trouble breathing.  You have shortness of breath.  You have chest pain. This information is not intended to replace advice given to you by your health care provider. Make sure you discuss any questions you have with your health care provider. Document Released: 12/13/2010 Document Revised: 08/03/2016 Document Reviewed: 08/03/2016 Elsevier Interactive Patient Education  2019 [employer].

## 2019-01-09 NOTE — Plan of Care (Signed)
°  Problem: Education: °Goal: Knowledge of General Education information will improve °Description: Including pain rating scale, medication(s)/side effects and non-pharmacologic comfort measures °Outcome: Progressing °  °Problem: Clinical Measurements: °Goal: Ability to maintain clinical measurements within normal limits will improve °Outcome: Progressing °Goal: Will remain free from infection °Outcome: Progressing °Goal: Respiratory complications will improve °Outcome: Progressing °  °Problem: Activity: °Goal: Risk for activity intolerance will decrease °Outcome: Progressing °  °Problem: Coping: °Goal: Level of anxiety will decrease °Outcome: Progressing °  °Problem: Pain Managment: °Goal: General experience of comfort will improve °Outcome: Progressing °  °
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
## 2019-01-10 DIAGNOSIS — S82041D Displaced comminuted fracture of right patella, subsequent encounter for closed fracture with routine healing: Secondary | ICD-10-CM | POA: Diagnosis not present

## 2019-01-10 DIAGNOSIS — Z7901 Long term (current) use of anticoagulants: Secondary | ICD-10-CM | POA: Diagnosis not present

## 2019-01-10 DIAGNOSIS — S52501D Unspecified fracture of the lower end of right radius, subsequent encounter for closed fracture with routine healing: Secondary | ICD-10-CM | POA: Diagnosis not present

## 2019-01-10 DIAGNOSIS — S2242XD Multiple fractures of ribs, left side, subsequent encounter for fracture with routine healing: Secondary | ICD-10-CM | POA: Diagnosis not present

## 2019-01-10 DIAGNOSIS — S52611D Displaced fracture of right ulna styloid process, subsequent encounter for closed fracture with routine healing: Secondary | ICD-10-CM | POA: Diagnosis not present

## 2019-01-10 DIAGNOSIS — S43101D Unspecified dislocation of right acromioclavicular joint, subsequent encounter: Secondary | ICD-10-CM | POA: Diagnosis not present

## 2019-01-10 DIAGNOSIS — S83004A Unspecified dislocation of right patella, initial encounter: Secondary | ICD-10-CM | POA: Diagnosis not present

## 2019-01-10 DIAGNOSIS — S27329D Contusion of lung, unspecified, subsequent encounter: Secondary | ICD-10-CM | POA: Diagnosis not present

## 2019-01-10 DIAGNOSIS — S82141D Displaced bicondylar fracture of right tibia, subsequent encounter for closed fracture with routine healing: Secondary | ICD-10-CM | POA: Diagnosis not present

## 2019-01-10 DIAGNOSIS — S82041A Displaced comminuted fracture of right patella, initial encounter for closed fracture: Secondary | ICD-10-CM | POA: Diagnosis not present

## 2019-01-10 DIAGNOSIS — S76111D Strain of right quadriceps muscle, fascia and tendon, subsequent encounter: Secondary | ICD-10-CM | POA: Diagnosis not present

## 2019-01-10 DIAGNOSIS — S82121A Displaced fracture of lateral condyle of right tibia, initial encounter for closed fracture: Secondary | ICD-10-CM | POA: Diagnosis not present

## 2019-01-10 DIAGNOSIS — F17210 Nicotine dependence, cigarettes, uncomplicated: Secondary | ICD-10-CM | POA: Diagnosis not present

## 2019-01-10 DIAGNOSIS — S52501A Unspecified fracture of the lower end of right radius, initial encounter for closed fracture: Secondary | ICD-10-CM | POA: Diagnosis not present

## 2019-01-10 DIAGNOSIS — T797XXD Traumatic subcutaneous emphysema, subsequent encounter: Secondary | ICD-10-CM | POA: Diagnosis not present

## 2019-01-12 DIAGNOSIS — S82141D Displaced bicondylar fracture of right tibia, subsequent encounter for closed fracture with routine healing: Secondary | ICD-10-CM | POA: Diagnosis not present

## 2019-01-12 DIAGNOSIS — S52501D Unspecified fracture of the lower end of right radius, subsequent encounter for closed fracture with routine healing: Secondary | ICD-10-CM | POA: Diagnosis not present

## 2019-01-12 DIAGNOSIS — S76111D Strain of right quadriceps muscle, fascia and tendon, subsequent encounter: Secondary | ICD-10-CM | POA: Diagnosis not present

## 2019-01-12 DIAGNOSIS — T797XXD Traumatic subcutaneous emphysema, subsequent encounter: Secondary | ICD-10-CM | POA: Diagnosis not present

## 2019-01-12 DIAGNOSIS — F17210 Nicotine dependence, cigarettes, uncomplicated: Secondary | ICD-10-CM | POA: Diagnosis not present

## 2019-01-12 DIAGNOSIS — S52611D Displaced fracture of right ulna styloid process, subsequent encounter for closed fracture with routine healing: Secondary | ICD-10-CM | POA: Diagnosis not present

## 2019-01-12 DIAGNOSIS — Z7901 Long term (current) use of anticoagulants: Secondary | ICD-10-CM | POA: Diagnosis not present

## 2019-01-12 DIAGNOSIS — S27329D Contusion of lung, unspecified, subsequent encounter: Secondary | ICD-10-CM | POA: Diagnosis not present

## 2019-01-12 DIAGNOSIS — S82041D Displaced comminuted fracture of right patella, subsequent encounter for closed fracture with routine healing: Secondary | ICD-10-CM | POA: Diagnosis not present

## 2019-01-12 DIAGNOSIS — S2242XD Multiple fractures of ribs, left side, subsequent encounter for fracture with routine healing: Secondary | ICD-10-CM | POA: Diagnosis not present

## 2019-01-12 DIAGNOSIS — S43101D Unspecified dislocation of right acromioclavicular joint, subsequent encounter: Secondary | ICD-10-CM | POA: Diagnosis not present

## 2019-01-16 ENCOUNTER — Ambulatory Visit
Admission: RE | Admit: 2019-01-16 | Discharge: 2019-01-16 | Disposition: A | Discharge status: Home / Self Care | Payer: BLUE CROSS/BLUE SHIELD | Source: Ambulatory Visit | Attending: General Surgery | Admitting: General Surgery

## 2019-01-16 ENCOUNTER — Other Ambulatory Visit: Payer: Self-pay

## 2019-01-16 ENCOUNTER — Other Ambulatory Visit: Payer: Self-pay | Admitting: General Surgery

## 2019-01-16 DIAGNOSIS — S76111D Strain of right quadriceps muscle, fascia and tendon, subsequent encounter: Secondary | ICD-10-CM | POA: Diagnosis not present

## 2019-01-16 DIAGNOSIS — S52501D Unspecified fracture of the lower end of right radius, subsequent encounter for closed fracture with routine healing: Secondary | ICD-10-CM | POA: Diagnosis not present

## 2019-01-16 DIAGNOSIS — F17210 Nicotine dependence, cigarettes, uncomplicated: Secondary | ICD-10-CM | POA: Diagnosis not present

## 2019-01-16 DIAGNOSIS — S82041D Displaced comminuted fracture of right patella, subsequent encounter for closed fracture with routine healing: Secondary | ICD-10-CM | POA: Diagnosis not present

## 2019-01-16 DIAGNOSIS — J939 Pneumothorax, unspecified: Secondary | ICD-10-CM | POA: Diagnosis not present

## 2019-01-16 DIAGNOSIS — Z7901 Long term (current) use of anticoagulants: Secondary | ICD-10-CM | POA: Diagnosis not present

## 2019-01-16 DIAGNOSIS — S43101D Unspecified dislocation of right acromioclavicular joint, subsequent encounter: Secondary | ICD-10-CM | POA: Diagnosis not present

## 2019-01-16 DIAGNOSIS — S2242XD Multiple fractures of ribs, left side, subsequent encounter for fracture with routine healing: Secondary | ICD-10-CM | POA: Diagnosis not present

## 2019-01-16 DIAGNOSIS — S52611D Displaced fracture of right ulna styloid process, subsequent encounter for closed fracture with routine healing: Secondary | ICD-10-CM | POA: Diagnosis not present

## 2019-01-16 DIAGNOSIS — S82141D Displaced bicondylar fracture of right tibia, subsequent encounter for closed fracture with routine healing: Secondary | ICD-10-CM | POA: Diagnosis not present

## 2019-01-16 DIAGNOSIS — S27329D Contusion of lung, unspecified, subsequent encounter: Secondary | ICD-10-CM | POA: Diagnosis not present

## 2019-01-16 DIAGNOSIS — T797XXD Traumatic subcutaneous emphysema, subsequent encounter: Secondary | ICD-10-CM | POA: Diagnosis not present

## 2019-01-20 DIAGNOSIS — S76111D Strain of right quadriceps muscle, fascia and tendon, subsequent encounter: Secondary | ICD-10-CM | POA: Diagnosis not present

## 2019-01-20 DIAGNOSIS — S82041D Displaced comminuted fracture of right patella, subsequent encounter for closed fracture with routine healing: Secondary | ICD-10-CM | POA: Diagnosis not present

## 2019-01-20 DIAGNOSIS — S52611D Displaced fracture of right ulna styloid process, subsequent encounter for closed fracture with routine healing: Secondary | ICD-10-CM | POA: Diagnosis not present

## 2019-01-20 DIAGNOSIS — S27329D Contusion of lung, unspecified, subsequent encounter: Secondary | ICD-10-CM | POA: Diagnosis not present

## 2019-01-20 DIAGNOSIS — S2242XS Multiple fractures of ribs, left side, sequela: Secondary | ICD-10-CM | POA: Diagnosis not present

## 2019-01-20 DIAGNOSIS — Z7901 Long term (current) use of anticoagulants: Secondary | ICD-10-CM | POA: Diagnosis not present

## 2019-01-20 DIAGNOSIS — S2242XD Multiple fractures of ribs, left side, subsequent encounter for fracture with routine healing: Secondary | ICD-10-CM | POA: Diagnosis not present

## 2019-01-20 DIAGNOSIS — S43101D Unspecified dislocation of right acromioclavicular joint, subsequent encounter: Secondary | ICD-10-CM | POA: Diagnosis not present

## 2019-01-20 DIAGNOSIS — F17210 Nicotine dependence, cigarettes, uncomplicated: Secondary | ICD-10-CM | POA: Diagnosis not present

## 2019-01-20 DIAGNOSIS — S82141D Displaced bicondylar fracture of right tibia, subsequent encounter for closed fracture with routine healing: Secondary | ICD-10-CM | POA: Diagnosis not present

## 2019-01-20 DIAGNOSIS — S52501D Unspecified fracture of the lower end of right radius, subsequent encounter for closed fracture with routine healing: Secondary | ICD-10-CM | POA: Diagnosis not present

## 2019-01-20 DIAGNOSIS — T797XXD Traumatic subcutaneous emphysema, subsequent encounter: Secondary | ICD-10-CM | POA: Diagnosis not present

## 2019-01-20 DIAGNOSIS — J939 Pneumothorax, unspecified: Secondary | ICD-10-CM | POA: Diagnosis not present

## 2019-01-21 DIAGNOSIS — S52561D Barton's fracture of right radius, subsequent encounter for closed fracture with routine healing: Secondary | ICD-10-CM | POA: Diagnosis not present

## 2019-01-21 DIAGNOSIS — S82041D Displaced comminuted fracture of right patella, subsequent encounter for closed fracture with routine healing: Secondary | ICD-10-CM | POA: Diagnosis not present

## 2019-01-21 DIAGNOSIS — S82121D Displaced fracture of lateral condyle of right tibia, subsequent encounter for closed fracture with routine healing: Secondary | ICD-10-CM | POA: Diagnosis not present

## 2019-01-21 DIAGNOSIS — S43132D Dislocation of left acromioclavicular joint, greater than 200% displacement, subsequent encounter: Secondary | ICD-10-CM | POA: Diagnosis not present

## 2019-01-22 DIAGNOSIS — S2242XD Multiple fractures of ribs, left side, subsequent encounter for fracture with routine healing: Secondary | ICD-10-CM | POA: Diagnosis not present

## 2019-01-22 DIAGNOSIS — T797XXD Traumatic subcutaneous emphysema, subsequent encounter: Secondary | ICD-10-CM | POA: Diagnosis not present

## 2019-01-22 DIAGNOSIS — S82041D Displaced comminuted fracture of right patella, subsequent encounter for closed fracture with routine healing: Secondary | ICD-10-CM | POA: Diagnosis not present

## 2019-01-22 DIAGNOSIS — S76111D Strain of right quadriceps muscle, fascia and tendon, subsequent encounter: Secondary | ICD-10-CM | POA: Diagnosis not present

## 2019-01-22 DIAGNOSIS — F17210 Nicotine dependence, cigarettes, uncomplicated: Secondary | ICD-10-CM | POA: Diagnosis not present

## 2019-01-22 DIAGNOSIS — S27329D Contusion of lung, unspecified, subsequent encounter: Secondary | ICD-10-CM | POA: Diagnosis not present

## 2019-01-22 DIAGNOSIS — S52501D Unspecified fracture of the lower end of right radius, subsequent encounter for closed fracture with routine healing: Secondary | ICD-10-CM | POA: Diagnosis not present

## 2019-01-22 DIAGNOSIS — S52611D Displaced fracture of right ulna styloid process, subsequent encounter for closed fracture with routine healing: Secondary | ICD-10-CM | POA: Diagnosis not present

## 2019-01-22 DIAGNOSIS — S43101D Unspecified dislocation of right acromioclavicular joint, subsequent encounter: Secondary | ICD-10-CM | POA: Diagnosis not present

## 2019-01-22 DIAGNOSIS — Z7901 Long term (current) use of anticoagulants: Secondary | ICD-10-CM | POA: Diagnosis not present

## 2019-01-22 DIAGNOSIS — S82141D Displaced bicondylar fracture of right tibia, subsequent encounter for closed fracture with routine healing: Secondary | ICD-10-CM | POA: Diagnosis not present

## 2019-01-27 DIAGNOSIS — S52611D Displaced fracture of right ulna styloid process, subsequent encounter for closed fracture with routine healing: Secondary | ICD-10-CM | POA: Diagnosis not present

## 2019-01-27 DIAGNOSIS — S82141D Displaced bicondylar fracture of right tibia, subsequent encounter for closed fracture with routine healing: Secondary | ICD-10-CM | POA: Diagnosis not present

## 2019-01-27 DIAGNOSIS — S2242XD Multiple fractures of ribs, left side, subsequent encounter for fracture with routine healing: Secondary | ICD-10-CM | POA: Diagnosis not present

## 2019-01-27 DIAGNOSIS — F17210 Nicotine dependence, cigarettes, uncomplicated: Secondary | ICD-10-CM | POA: Diagnosis not present

## 2019-01-27 DIAGNOSIS — S76111D Strain of right quadriceps muscle, fascia and tendon, subsequent encounter: Secondary | ICD-10-CM | POA: Diagnosis not present

## 2019-01-27 DIAGNOSIS — T797XXD Traumatic subcutaneous emphysema, subsequent encounter: Secondary | ICD-10-CM | POA: Diagnosis not present

## 2019-01-27 DIAGNOSIS — S52501D Unspecified fracture of the lower end of right radius, subsequent encounter for closed fracture with routine healing: Secondary | ICD-10-CM | POA: Diagnosis not present

## 2019-01-27 DIAGNOSIS — S82041D Displaced comminuted fracture of right patella, subsequent encounter for closed fracture with routine healing: Secondary | ICD-10-CM | POA: Diagnosis not present

## 2019-01-27 DIAGNOSIS — Z7901 Long term (current) use of anticoagulants: Secondary | ICD-10-CM | POA: Diagnosis not present

## 2019-01-27 DIAGNOSIS — S43101D Unspecified dislocation of right acromioclavicular joint, subsequent encounter: Secondary | ICD-10-CM | POA: Diagnosis not present

## 2019-01-27 DIAGNOSIS — S27329D Contusion of lung, unspecified, subsequent encounter: Secondary | ICD-10-CM | POA: Diagnosis not present

## 2019-01-30 DIAGNOSIS — S43101D Unspecified dislocation of right acromioclavicular joint, subsequent encounter: Secondary | ICD-10-CM | POA: Diagnosis not present

## 2019-01-30 DIAGNOSIS — S82041D Displaced comminuted fracture of right patella, subsequent encounter for closed fracture with routine healing: Secondary | ICD-10-CM | POA: Diagnosis not present

## 2019-01-30 DIAGNOSIS — S76111D Strain of right quadriceps muscle, fascia and tendon, subsequent encounter: Secondary | ICD-10-CM | POA: Diagnosis not present

## 2019-01-30 DIAGNOSIS — T797XXD Traumatic subcutaneous emphysema, subsequent encounter: Secondary | ICD-10-CM | POA: Diagnosis not present

## 2019-01-30 DIAGNOSIS — S82141D Displaced bicondylar fracture of right tibia, subsequent encounter for closed fracture with routine healing: Secondary | ICD-10-CM | POA: Diagnosis not present

## 2019-01-30 DIAGNOSIS — S27329D Contusion of lung, unspecified, subsequent encounter: Secondary | ICD-10-CM | POA: Diagnosis not present

## 2019-01-30 DIAGNOSIS — S52501D Unspecified fracture of the lower end of right radius, subsequent encounter for closed fracture with routine healing: Secondary | ICD-10-CM | POA: Diagnosis not present

## 2019-01-30 DIAGNOSIS — F17210 Nicotine dependence, cigarettes, uncomplicated: Secondary | ICD-10-CM | POA: Diagnosis not present

## 2019-01-30 DIAGNOSIS — Z7901 Long term (current) use of anticoagulants: Secondary | ICD-10-CM | POA: Diagnosis not present

## 2019-01-30 DIAGNOSIS — S52611D Displaced fracture of right ulna styloid process, subsequent encounter for closed fracture with routine healing: Secondary | ICD-10-CM | POA: Diagnosis not present

## 2019-01-30 DIAGNOSIS — S2242XD Multiple fractures of ribs, left side, subsequent encounter for fracture with routine healing: Secondary | ICD-10-CM | POA: Diagnosis not present

## 2019-02-04 DIAGNOSIS — S52561D Barton's fracture of right radius, subsequent encounter for closed fracture with routine healing: Secondary | ICD-10-CM | POA: Diagnosis not present

## 2019-02-04 DIAGNOSIS — S82041D Displaced comminuted fracture of right patella, subsequent encounter for closed fracture with routine healing: Secondary | ICD-10-CM | POA: Diagnosis not present

## 2019-02-04 DIAGNOSIS — S82121D Displaced fracture of lateral condyle of right tibia, subsequent encounter for closed fracture with routine healing: Secondary | ICD-10-CM | POA: Diagnosis not present

## 2019-02-09 DIAGNOSIS — S82041A Displaced comminuted fracture of right patella, initial encounter for closed fracture: Secondary | ICD-10-CM | POA: Diagnosis not present

## 2019-02-09 DIAGNOSIS — S52501A Unspecified fracture of the lower end of right radius, initial encounter for closed fracture: Secondary | ICD-10-CM | POA: Diagnosis not present

## 2019-02-09 DIAGNOSIS — S83004A Unspecified dislocation of right patella, initial encounter: Secondary | ICD-10-CM | POA: Diagnosis not present

## 2019-02-09 DIAGNOSIS — S82121A Displaced fracture of lateral condyle of right tibia, initial encounter for closed fracture: Secondary | ICD-10-CM | POA: Diagnosis not present

## 2019-02-25 DIAGNOSIS — S82041D Displaced comminuted fracture of right patella, subsequent encounter for closed fracture with routine healing: Secondary | ICD-10-CM | POA: Diagnosis not present

## 2019-02-25 DIAGNOSIS — S43132D Dislocation of left acromioclavicular joint, greater than 200% displacement, subsequent encounter: Secondary | ICD-10-CM | POA: Diagnosis not present

## 2019-02-25 DIAGNOSIS — S82121D Displaced fracture of lateral condyle of right tibia, subsequent encounter for closed fracture with routine healing: Secondary | ICD-10-CM | POA: Diagnosis not present

## 2019-02-25 DIAGNOSIS — S52561D Barton's fracture of right radius, subsequent encounter for closed fracture with routine healing: Secondary | ICD-10-CM | POA: Diagnosis not present

## 2019-03-11 DIAGNOSIS — S82041A Displaced comminuted fracture of right patella, initial encounter for closed fracture: Secondary | ICD-10-CM | POA: Diagnosis not present

## 2019-03-11 DIAGNOSIS — S52501A Unspecified fracture of the lower end of right radius, initial encounter for closed fracture: Secondary | ICD-10-CM | POA: Diagnosis not present

## 2019-03-11 DIAGNOSIS — S82121A Displaced fracture of lateral condyle of right tibia, initial encounter for closed fracture: Secondary | ICD-10-CM | POA: Diagnosis not present

## 2019-03-11 DIAGNOSIS — S83004A Unspecified dislocation of right patella, initial encounter: Secondary | ICD-10-CM | POA: Diagnosis not present

## 2019-03-18 DIAGNOSIS — S82041D Displaced comminuted fracture of right patella, subsequent encounter for closed fracture with routine healing: Secondary | ICD-10-CM | POA: Diagnosis not present

## 2019-03-18 DIAGNOSIS — S43132D Dislocation of left acromioclavicular joint, greater than 200% displacement, subsequent encounter: Secondary | ICD-10-CM | POA: Diagnosis not present

## 2019-03-18 DIAGNOSIS — S52561D Barton's fracture of right radius, subsequent encounter for closed fracture with routine healing: Secondary | ICD-10-CM | POA: Diagnosis not present

## 2019-04-13 DIAGNOSIS — N2889 Other specified disorders of kidney and ureter: Secondary | ICD-10-CM | POA: Diagnosis not present

## 2019-04-13 DIAGNOSIS — F17210 Nicotine dependence, cigarettes, uncomplicated: Secondary | ICD-10-CM | POA: Diagnosis not present

## 2019-04-16 ENCOUNTER — Other Ambulatory Visit: Payer: Self-pay | Admitting: Family Medicine

## 2019-04-16 DIAGNOSIS — N2889 Other specified disorders of kidney and ureter: Secondary | ICD-10-CM

## 2019-04-22 DIAGNOSIS — S52561D Barton's fracture of right radius, subsequent encounter for closed fracture with routine healing: Secondary | ICD-10-CM | POA: Diagnosis not present

## 2019-04-22 DIAGNOSIS — S43132D Dislocation of left acromioclavicular joint, greater than 200% displacement, subsequent encounter: Secondary | ICD-10-CM | POA: Diagnosis not present

## 2019-04-22 DIAGNOSIS — S82041D Displaced comminuted fracture of right patella, subsequent encounter for closed fracture with routine healing: Secondary | ICD-10-CM | POA: Diagnosis not present

## 2019-04-22 DIAGNOSIS — S82121D Displaced fracture of lateral condyle of right tibia, subsequent encounter for closed fracture with routine healing: Secondary | ICD-10-CM | POA: Diagnosis not present

## 2019-05-22 ENCOUNTER — Ambulatory Visit
Admission: RE | Admit: 2019-05-22 | Discharge: 2019-05-22 | Disposition: A | Discharge status: Home / Self Care | Payer: BC Managed Care – PPO | Source: Ambulatory Visit | Attending: Family Medicine | Admitting: Family Medicine

## 2019-05-22 DIAGNOSIS — N2889 Other specified disorders of kidney and ureter: Secondary | ICD-10-CM

## 2019-05-22 DIAGNOSIS — N281 Cyst of kidney, acquired: Secondary | ICD-10-CM | POA: Diagnosis not present
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
## 2022-02-28 ENCOUNTER — Other Ambulatory Visit: Payer: Self-pay

## 2022-02-28 ENCOUNTER — Encounter (HOSPITAL_BASED_OUTPATIENT_CLINIC_OR_DEPARTMENT_OTHER): Payer: Self-pay | Admitting: Emergency Medicine

## 2022-02-28 ENCOUNTER — Emergency Department (HOSPITAL_BASED_OUTPATIENT_CLINIC_OR_DEPARTMENT_OTHER): Payer: BC Managed Care – PPO

## 2022-02-28 ENCOUNTER — Emergency Department (HOSPITAL_BASED_OUTPATIENT_CLINIC_OR_DEPARTMENT_OTHER)
Admission: EM | Admit: 2022-02-28 | Discharge: 2022-02-28 | Disposition: A | Discharge status: Home / Self Care | Payer: BC Managed Care – PPO | Attending: Emergency Medicine | Admitting: Emergency Medicine

## 2022-02-28 DIAGNOSIS — M5441 Lumbago with sciatica, right side: Secondary | ICD-10-CM | POA: Diagnosis not present

## 2022-02-28 DIAGNOSIS — M5431 Sciatica, right side: Secondary | ICD-10-CM

## 2022-02-28 DIAGNOSIS — M545 Low back pain, unspecified: Secondary | ICD-10-CM | POA: Diagnosis present

## 2022-02-28 DIAGNOSIS — M79604 Pain in right leg: Secondary | ICD-10-CM | POA: Insufficient documentation

## 2022-02-28 MED ORDER — KETOROLAC TROMETHAMINE 30 MG/ML IJ SOLN
30.0000 mg | Freq: Once | INTRAMUSCULAR | Status: AC
  Administered 2022-02-28: 30 mg via INTRAMUSCULAR
  Filled 2022-02-28: qty 1

## 2022-02-28 MED ORDER — DEXAMETHASONE SODIUM PHOSPHATE 10 MG/ML IJ SOLN
10.0000 mg | Freq: Once | INTRAMUSCULAR | Status: AC
  Administered 2022-02-28: 10 mg via INTRAMUSCULAR
  Filled 2022-02-28: qty 1

## 2022-02-28 MED ORDER — METHOCARBAMOL 500 MG PO TABS: 500.0000 mg | ORAL_TABLET | Freq: Three times a day (TID) | ORAL | 0 refills | Status: AC | PRN

## 2022-02-28 MED ORDER — MELOXICAM 7.5 MG PO TABS: 7.5000 mg | ORAL_TABLET | Freq: Every day | ORAL | 0 refills | Status: AC

## 2022-02-28 MED ORDER — DICLOFENAC SODIUM 1 % EX GEL: 2.0000 g | Freq: Four times a day (QID) | CUTANEOUS | 0 refills | Status: AC

## 2022-02-28 NOTE — ED Notes (Signed)
Pt agreeable with d/c plan as discussed by provider- This nurse has verbally reinforced d/c instructions and provided pt with written copy- pt acknowledges verbal understanding and denies any additional questions, concerns, needs- pt ambulatory independently at d/c with steady gait; no distress; vitals stable.

## 2022-02-28 NOTE — Discharge Instructions (Signed)
You were seen in the emergency room today with right leg pain.  I suspect you have discomfort from inflammation of the sciatic nerve.  Your ultrasound did not show any blood clots in the legs.  Please take the medications as prescribed.  If you take meloxicam do not take ibuprofen or naproxen as this is a similar medication.  Return with any new or suddenly worsening symptoms.

## 2022-02-28 NOTE — ED Triage Notes (Signed)
Pt arrives to ED with c/o right leg pain that started x1 month ago. Pain is worse when he walks. Pt with past hx of surgery to the leg.

## 2022-02-28 NOTE — ED Notes (Signed)
Pt ambulatory from hall bathroom independently with steady gait-- pt has been instructed to remove bottoms to prepare for provider exam -- pt confirms ongoing RLE pain with h/o R knee surgery.  RLE distal neurovascular status intact.  Pt states pain starts at R buttock and radiates to R ankle - denies h/o sciatica.  Pt also denies recent injury/trauma to RLE.   Pain level 7-8/10 with pain described as throbbing.  Will continue to monitor for acute changes and maintain plan of care as pt awaits ED provider
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
